# Patient Record
Sex: Male | Born: 1965
Health system: Southern US, Community
[De-identification: ages and names within clinical notes are randomized; demographics above are authoritative.]

## PROBLEM LIST (undated history)

## (undated) DIAGNOSIS — K859 Acute pancreatitis without necrosis or infection, unspecified: Secondary | ICD-10-CM

## (undated) DIAGNOSIS — K219 Gastro-esophageal reflux disease without esophagitis: Secondary | ICD-10-CM

## (undated) DIAGNOSIS — I252 Old myocardial infarction: Secondary | ICD-10-CM

## (undated) DIAGNOSIS — J302 Other seasonal allergic rhinitis: Secondary | ICD-10-CM

## (undated) DIAGNOSIS — F329 Major depressive disorder, single episode, unspecified: Secondary | ICD-10-CM

## (undated) DIAGNOSIS — I10 Essential (primary) hypertension: Secondary | ICD-10-CM

## (undated) DIAGNOSIS — F32A Depression, unspecified: Secondary | ICD-10-CM

## (undated) HISTORY — DX: Depression, unspecified: F32.A

## (undated) HISTORY — DX: Old myocardial infarction: I25.2

## (undated) HISTORY — DX: Essential (primary) hypertension: I10

## (undated) HISTORY — DX: Major depressive disorder, single episode, unspecified: F32.9

## (undated) HISTORY — PX: LITHOTRIPSY: SUR834

---

## 2011-04-19 DIAGNOSIS — I252 Old myocardial infarction: Secondary | ICD-10-CM

## 2011-04-19 HISTORY — PX: CORONARY STENT PLACEMENT: SHX1402

## 2011-04-19 HISTORY — DX: Old myocardial infarction: I25.2

## 2017-06-21 ENCOUNTER — Encounter: Payer: Self-pay | Admitting: Family Medicine

## 2017-06-21 ENCOUNTER — Ambulatory Visit (INDEPENDENT_AMBULATORY_CARE_PROVIDER_SITE_OTHER): Payer: Self-pay | Admitting: Family Medicine

## 2017-06-21 VITALS — BP 122/78 | HR 84 | Temp 98.4°F | Ht 70.0 in | Wt 226.2 lb

## 2017-06-21 DIAGNOSIS — I2510 Atherosclerotic heart disease of native coronary artery without angina pectoris: Secondary | ICD-10-CM

## 2017-06-21 DIAGNOSIS — J0100 Acute maxillary sinusitis, unspecified: Secondary | ICD-10-CM

## 2017-06-21 MED ORDER — ROSUVASTATIN CALCIUM 20 MG PO TABS: 20.0000 mg | ORAL_TABLET | Freq: Every day | ORAL | 0 refills | Status: DC

## 2017-06-21 MED ORDER — AMOXICILLIN-POT CLAVULANATE 875-125 MG PO TABS: 1.0000 | ORAL_TABLET | Freq: Two times a day (BID) | ORAL | 0 refills | Status: DC

## 2017-06-21 NOTE — Patient Instructions (Addendum)
Continue to push fluids, practice good hand hygiene, and cover your mouth if you cough.  If you start having fevers, shaking or shortness of breath, seek immediate care.  Ibuprofen 400-600 mg (2-3 over the counter strength tabs) every 6 hours as needed for pain.  OK to take Tylenol 1000 mg (2 extra strength tabs) or 975 mg (3 regular strength tabs) every 6 hours as needed.  If this new medicine is too expensive, let us know. Do not fill it.  Let us know if you need anything.

## 2017-06-21 NOTE — Progress Notes (Signed)
Pre visit review using our clinic review tool, if applicable. No additional management support is needed unless otherwise documented below in the visit note. 

## 2017-06-21 NOTE — Progress Notes (Signed)
Chief Complaint  Patient presents with  . Establish Care  . Sinusitis       New Patient Visit SUBJECTIVE: HPI: Luke Vasquez is an 52 y.o.male who is being seen for establishing care.  Duration: 2 weeks  Associated symptoms: sinus congestion, sinus pain, rhinorrhea, itchy watery eyes and cough Denies: ear pain, ear drainage, sore throat, wheezing, shortness of breath, myalgia and fevers Treatment to date: Dayquil Sick contacts: Yes   2013 had MI. Used to be on atorvastatin but did not tolerate it well. Admittedly does not drink much fluids. No Cp or SOB.  No Known Allergies  Past Medical History:  Diagnosis Date  . History of heart attack 2013  . Hypertension    Past Surgical History:  Procedure Laterality Date  . LITHOTRIPSY     Social History   Socioeconomic History  . Marital status: Single  Tobacco Use  . Smoking status: Former Research scientist (life sciences)  . Smokeless tobacco: Never Used  Substance and Sexual Activity  . Alcohol use: Yes    Comment: socially  . Drug use: No   Family History  Problem Relation Age of Onset  . Hypertension Mother   . Hypertension Father      Current Outpatient Medications:  .  aspirin EC 81 MG tablet, Take 81 mg by mouth daily., Disp: , Rfl:  .  cetirizine (ZYRTEC) 10 MG tablet, Take 10 mg by mouth daily., Disp: , Rfl:  .  lisinopril (PRINIVIL,ZESTRIL) 10 MG tablet, Take 10 mg by mouth daily., Disp: , Rfl:  .  montelukast (SINGULAIR) 10 MG tablet, Take 10 mg by mouth at bedtime., Disp: , Rfl:  .  amoxicillin-clavulanate (AUGMENTIN) 875-125 MG tablet, Take 1 tablet by mouth 2 (two) times daily., Disp: 20 tablet, Rfl: 0 .  rosuvastatin (CRESTOR) 20 MG tablet, Take 1 tablet (20 mg total) by mouth daily., Disp: 30 tablet, Rfl: 0  ROS Cardiovascular: Denies chest pain  Respiratory: Denies dyspnea   OBJECTIVE: BP 122/78 (BP Location: Left Arm, Patient Position: Sitting, Cuff Size: Large)   Pulse 84   Temp 98.4 F (36.9 C) (Oral)   Ht 5\' 10"   (1.778 m)   Wt 226 lb 4 oz (102.6 kg)   SpO2 96%   BMI 32.46 kg/m   Constitutional: -  VS reviewed -  Well developed, well nourished, appears stated age -  No apparent distress  Psychiatric: -  Oriented to person, place, and time -  Memory intact -  Affect and mood normal -  Fluent conversation, good eye contact -  Judgment and insight age appropriate  Eye: -  Conjunctivae clear, no discharge -  Pupils symmetric, round, reactive to light  ENMT: -  MMM    Pharynx moist, no exudate, no erythema -  Nares patent w/o dc, +TTP over max sinuses b/l -  Ears neg b/l  Neck: -  No gross swelling, no palpable masses -  Thyroid midline, not enlarged, mobile, no palpable masses  Cardiovascular: -  RRR -  No LE edema  Respiratory: -  Normal respiratory effort, no accessory muscle use, no retraction -  Breath sounds equal, no wheezes, no ronchi, no crackles  Musculoskeletal: -  No clubbing, no cyanosis -  Gait normal  Skin: -  No significant lesion on inspection -  Warm and dry to palpation   ASSESSMENT/PLAN: Acute maxillary sinusitis, recurrence not specified - Plan: amoxicillin-clavulanate (AUGMENTIN) 875-125 MG tablet  Coronary artery disease involving native heart without angina pectoris, unspecified vessel or  lesion type - Plan: aspirin EC 81 MG tablet, rosuvastatin (CRESTOR) 20 MG tablet  Patient instructed to sign release of records form from his previous PCP. Given duration, will treat. Stay on Zyrtec and Singulair, go back on INCS. Start another statin. Stay hydrated, let us know if this is too expensive. Patient should return when he has insurance for CPE. The patient voiced understanding and agreement to the plan.   [place], DO 06/21/17  10:23 AM

## 2017-07-17 ENCOUNTER — Other Ambulatory Visit: Payer: Self-pay | Admitting: Family Medicine

## 2017-07-17 DIAGNOSIS — I2510 Atherosclerotic heart disease of native coronary artery without angina pectoris: Secondary | ICD-10-CM

## 2017-09-03 ENCOUNTER — Other Ambulatory Visit: Payer: Self-pay | Admitting: Family Medicine

## 2017-09-03 DIAGNOSIS — I2510 Atherosclerotic heart disease of native coronary artery without angina pectoris: Secondary | ICD-10-CM

## 2017-10-09 ENCOUNTER — Other Ambulatory Visit: Payer: Self-pay | Admitting: Family Medicine

## 2017-10-09 DIAGNOSIS — I2510 Atherosclerotic heart disease of native coronary artery without angina pectoris: Secondary | ICD-10-CM

## 2017-10-10 ENCOUNTER — Encounter: Payer: Self-pay | Admitting: Family

## 2017-10-10 ENCOUNTER — Ambulatory Visit: Payer: 59 | Admitting: Family

## 2017-10-10 VITALS — BP 125/88 | HR 74 | Temp 98.4°F | Resp 18 | Ht 70.0 in | Wt 213.4 lb

## 2017-10-10 DIAGNOSIS — R110 Nausea: Secondary | ICD-10-CM | POA: Diagnosis not present

## 2017-10-10 DIAGNOSIS — A084 Viral intestinal infection, unspecified: Secondary | ICD-10-CM | POA: Diagnosis not present

## 2017-10-10 DIAGNOSIS — I2510 Atherosclerotic heart disease of native coronary artery without angina pectoris: Secondary | ICD-10-CM

## 2017-10-10 MED ORDER — ONDANSETRON 4 MG PO TBDP: 4.0000 mg | ORAL_TABLET | Freq: Three times a day (TID) | ORAL | 0 refills | Status: DC | PRN

## 2017-10-10 MED ORDER — ROSUVASTATIN CALCIUM 20 MG PO TABS: 20.0000 mg | ORAL_TABLET | Freq: Every day | ORAL | 2 refills | Status: DC

## 2017-10-10 MED ORDER — ONDANSETRON HCL 4 MG/2ML IJ SOLN
4.0000 mg | Freq: Once | INTRAMUSCULAR | Status: AC
  Administered 2017-10-10: 4 mg via INTRAMUSCULAR

## 2017-10-10 MED ORDER — CETIRIZINE HCL 10 MG PO TABS: 10.0000 mg | ORAL_TABLET | Freq: Every day | ORAL | 2 refills | Status: AC

## 2017-10-10 MED FILL — ONDANSETRON ODT 4 MG TABLET: 4 | 7 days supply | Qty: 20 | Fill #0

## 2017-10-10 NOTE — Progress Notes (Signed)
Subjective:    Patient ID: LOC FEINSTEIN, male    DOB: 05-24-1965, 52 y.o.   MRN: [id number]  HPI  Mr.  Hippler is a 52 yr old male who presents today with c/o N/V/Diarrhea. Symptoms began this AM.    Reports that he left the house at 6:15 AM and had + nausea. Vomited multiple times at work.  Had diarrhea x 1 this AM.  Reports some lower abdominal pain. No blood in stool.  He drank 1/2 cup of water this AM.  Denies any known sick contacts or recent travel.   Review of Systems    see HPI  Past Medical History:  Diagnosis Date  . Depression   . History of heart attack 2013  . Hypertension      Social History   Socioeconomic History  . Marital status: Single    Spouse name: Not on file  . Number of children: Not on file  . Years of education: Not on file  . Highest education level: Not on file  Occupational History  . Not on file  Social Needs  . Financial resource strain: Not on file  . Food insecurity:    Worry: Not on file    Inability: Not on file  . Transportation needs:    Medical: Not on file    Non-medical: Not on file  Tobacco Use  . Smoking status: Former Research scientist (life sciences)  . Smokeless tobacco: Never Used  Substance and Sexual Activity  . Alcohol use: Yes    Comment: socially  . Drug use: No  . Sexual activity: Not on file  Lifestyle  . Physical activity:    Days per week: Not on file    Minutes per session: Not on file  . Stress: Not on file  Relationships  . Social connections:    Talks on phone: Not on file    Gets together: Not on file    Attends religious service: Not on file    Active member of club or organization: Not on file    Attends meetings of clubs or organizations: Not on file    Relationship status: Not on file  . Intimate partner violence:    Fear of current or ex partner: Not on file    Emotionally abused: Not on file    Physically abused: Not on file    Forced sexual activity: Not on file  Other Topics Concern  . Not on file    Social History Narrative  . Not on file    Past Surgical History:  Procedure Laterality Date  . CORONARY STENT PLACEMENT  2013  . LITHOTRIPSY      Family History  Problem Relation Age of Onset  . Hypertension Mother   . Hypertension Father   . High blood pressure Brother     No Known Allergies  Current Outpatient Medications on File Prior to Visit  Medication Sig Dispense Refill  . aspirin EC 81 MG tablet Take 81 mg by mouth daily.    Marland Kitchen lisinopril (PRINIVIL,ZESTRIL) 10 MG tablet Take 10 mg by mouth daily.    . montelukast (SINGULAIR) 10 MG tablet Take 10 mg by mouth at bedtime.     No current facility-administered medications on file prior to visit.     BP 125/88 (BP Location: Right Arm, Cuff Size: Normal)   Pulse 74   Temp 98.4 F (36.9 C) (Oral)   Resp 18   Ht 5\' 10"  (1.778 m)   Wt 213 lb  6.4 oz (96.8 kg)   SpO2 97%   BMI 30.62 kg/m    Objective:   Physical Exam  Constitutional: He is oriented to person, place, and time. He appears well-developed and well-nourished. No distress.  HENT:  Head: Normocephalic and atraumatic.  Cardiovascular: Normal rate and regular rhythm.  No murmur heard. Pulmonary/Chest: Effort normal and breath sounds normal. No respiratory distress. He has no wheezes. He has no rales.  Abdominal: He exhibits no distension. Bowel sounds are increased. There is generalized tenderness. There is no rigidity and no guarding.  Musculoskeletal: He exhibits no edema.  Neurological: He is alert and oriented to person, place, and time.  Skin: Skin is warm and dry.  Psychiatric: He has a normal mood and affect. His behavior is normal. Thought content normal.          Assessment & Plan:  Viral gastroenteritis-symptoms are most consistent with viral gastroenteritis.  Zofran 4 mg IM here in the office.  He is encouraged to drink small frequent sips of liquids such as Gatorade today and slowly advance his diet as tolerated.  He may use oral Zofran  every 6-8 hours as needed.  He is advised that should he develop worsening abdominal pain or inability to keep down food or liquid that he should proceed to the ER.  Patient verbalizes understanding.

## 2017-10-10 NOTE — Patient Instructions (Signed)
You may use zofran 4mg  every 6-8 hrs as needed for nausea/vomitting. Drink small frequent sips of gatorade today.  Go to the ER if you develop severe/worsening abdominal pain.   Viral Gastroenteritis, Adult Viral gastroenteritis is also known as the stomach flu. This condition is caused by certain germs (viruses). These germs can be passed from person to person very easily (are very contagious). This condition can cause sudden watery poop (diarrhea), fever, and throwing up (vomiting). Having watery poop and throwing up can make you feel weak and cause you to get dehydrated. Dehydration can make you tired and thirsty, make you have a dry mouth, and make it so you pee (urinate) less often. Older adults and people with other diseases or a weak defense system (immune system) are at higher risk for dehydration. It is important to replace the fluids that you lose from having watery poop and throwing up. Follow these instructions at home: Follow instructions from your doctor about how to care for yourself at home. Eating and drinking  Follow these instructions as told by your doctor:  Take an oral rehydration solution (ORS). This is a drink that is sold at pharmacies and stores.  Drink clear fluids in small amounts as you are able, such as: ? Water. ? Ice chips. ? Diluted fruit juice. ? Low-calorie sports drinks.  Eat bland, easy-to-digest foods in small amounts as you are able, such as: ? Bananas. ? Applesauce. ? Rice. ? Low-fat (lean) meats. ? Toast. ? Crackers.  Avoid fluids that have a lot of sugar or caffeine in them.  Avoid alcohol.  Avoid spicy or fatty foods.  General instructions  Drink enough fluid to keep your pee (urine) clear or pale yellow.  Wash your hands often. If you cannot use soap and water, use hand sanitizer.  Make sure that all people in your home wash their hands well and often.  Rest at home while you get better.  Take over-the-counter and prescription  medicines only as told by your doctor.  Watch your condition for any changes.  Take a warm bath to help with any burning or pain from having watery poop.  Keep all follow-up visits as told by your doctor. This is important. Contact a doctor if:  You cannot keep fluids down.  Your symptoms get worse.  You have new symptoms.  You feel light-headed or dizzy.  You have muscle cramps. Get help right away if:  You have chest pain.  You feel very weak or you pass out (faint).  You see blood in your throw-up.  Your throw-up looks like coffee grounds.  You have bloody or black poop (stools) or poop that look like tar.  You have a very bad headache, a stiff neck, or both.  You have a rash.  You have very bad pain, cramping, or bloating in your belly (abdomen).  You have trouble breathing.  You are breathing very quickly.  Your heart is beating very quickly.  Your skin feels cold and clammy.  You feel confused.  You have pain when you pee.  You have signs of dehydration, such as: ? Dark pee, hardly any pee, or no pee. ? Cracked lips. ? Dry mouth. ? Sunken eyes. ? Sleepiness. ? Weakness. This information is not intended to replace advice given to you by your health care provider. Make sure you discuss any questions you have with your health care provider. Document Released: 09/21/2007 Document Revised: 10/23/2015 Document Reviewed: 12/09/2014 Elsevier Interactive Patient Education  2017 Elsevier Inc.  

## 2017-10-11 ENCOUNTER — Encounter: Payer: Self-pay | Admitting: Family

## 2017-10-11 ENCOUNTER — Telehealth: Payer: Self-pay | Admitting: Family Medicine

## 2017-10-11 NOTE — Telephone Encounter (Signed)
Letter placed at front desk for pick up. Left detailed message on pt's voicemail. Advised pt to call if he has a fax # to send letter to employer, otherwise may pick up letter tomorrow from 7am to 5pm.

## 2017-10-11 NOTE — Telephone Encounter (Signed)
Copied from [place] [phone]. Topic: General - Other >> Oct 11, 2017  7:55 AM Synthia Innocent wrote: Reason for CRM: Patient is still not feeling well, requesting a work note for today also. Was told at appt he could call back for extended work note. Please advise.

## 2017-10-11 NOTE — Telephone Encounter (Signed)
Pt called back to check on status of work note. Please advise

## 2017-10-11 NOTE — Telephone Encounter (Signed)
Copied from [place] [id number]. Topic: General - Other >> Oct 11, 2017  5:04 PM Cecelia Byars, NT wrote: Reason for CRM: Patient called to leave fax # for  note for work  [;ease fax to [phone] , thanks

## 2017-10-11 NOTE — Telephone Encounter (Signed)
Please see letter- it has been updated.

## 2017-10-12 ENCOUNTER — Telehealth: Payer: Self-pay | Admitting: Family Medicine

## 2017-10-12 NOTE — Telephone Encounter (Signed)
Copied from [place] [id number]. Topic: General - Other >> Oct 12, 2017  9:14 AM Judyann Munson wrote: Reason for CRM:  patient is calling to state he has not received his doctor note that was faxed yesterday, he is requesting it be faxed again to this number [phone]). Please advise   Letter has been now faxed/patient informed/will pickup copy next week of letter as well.

## 2017-10-13 NOTE — Telephone Encounter (Signed)
Work note faxed to below # and detailed message left on pt's voicemail regarding completion.

## 2017-11-02 ENCOUNTER — Ambulatory Visit: Payer: Self-pay | Admitting: *Deleted

## 2017-11-02 NOTE — Telephone Encounter (Signed)
Patient phoned while at work. He began having  chest pressure yesterday that has not subsided. He took antacid tabs yesterday with no relief. Noted he is talking in broken sentences and sounds in distress while we talk. Advised patient call 911 immediately. He is at work with the nearest emergency facility being [facility].   Reason for Disposition . [1] Chest pain lasts > 5 minutes AND [2] history of heart disease  (i.e., heart attack, bypass surgery, angina, angioplasty, CHF; not just a heart murmur)  Answer Assessment - Initial Assessment Questions 1. LOCATION: "Where does it hurt?"       Mid line chest pain-pressure 2. RADIATION: "Does the pain go anywhere else?" (e.g., into neck, jaw, arms, back)     no 3. ONSET: "When did the chest pain begin?" (Minutes, hours or days)      Started yesterday morning.  4. PATTERN "Does the pain come and go, or has it been constant since it started?"  "Does it get worse with exertion?"      constant 5. DURATION: "How long does it last" (e.g., seconds, minutes, hours)     Started yesterday and has not stopped. 6. SEVERITY: "How bad is the pain?"  (e.g., Scale 1-10; mild, moderate, or severe)    - MILD (1-3): doesn't interfere with normal activities     - MODERATE (4-7): interferes with normal activities or awakens from sleep    - SEVERE (8-10): excruciating pain, unable to do any normal activities       Patient stated 6 7. CARDIAC RISK FACTORS: "Do you have any history of heart problems or risk factors for heart disease?" (e.g., prior heart attack, angina; high blood pressure, diabetes, being overweight, high cholesterol, smoking, or strong family history of heart disease)     Hx of MI 8. PULMONARY RISK FACTORS: "Do you have any history of lung disease?"  (e.g., blood clots in lung, asthma, emphysema, birth control pills)      9. CAUSE: "What do you think is causing the chest pain?"     10. OTHER SYMPTOMS: "Do you have any other symptoms?" (e.g.,  dizziness, nausea, vomiting, sweating, fever, difficulty breathing, cough)        11. PREGNANCY: "Is there any chance you are pregnant?" "When was your last menstrual period?"       na  Protocols used: CHEST PAIN-A-AH

## 2017-11-06 ENCOUNTER — Encounter: Payer: Self-pay | Admitting: Family Medicine

## 2017-11-06 ENCOUNTER — Ambulatory Visit (INDEPENDENT_AMBULATORY_CARE_PROVIDER_SITE_OTHER): Payer: 59 | Admitting: Family Medicine

## 2017-11-06 VITALS — BP 108/72 | HR 68 | Temp 98.5°F | Ht 70.0 in | Wt 218.0 lb

## 2017-11-06 DIAGNOSIS — R1013 Epigastric pain: Secondary | ICD-10-CM | POA: Diagnosis not present

## 2017-11-06 MED ORDER — OMEPRAZOLE 20 MG PO CPDR: 20.0000 mg | DELAYED_RELEASE_CAPSULE | Freq: Two times a day (BID) | ORAL | 1 refills | Status: DC

## 2017-11-06 MED ORDER — GI COCKTAIL ~~LOC~~
30.0000 mL | Freq: Once | ORAL | Status: AC
  Administered 2017-11-06: 30 mL via ORAL

## 2017-11-06 NOTE — Patient Instructions (Signed)
The only lifestyle changes that have data behind them are weight loss for the overweight/obese and elevating the head of the bed. Finding out which foods/positions are triggers is important.  OK to stop Ranitidine.  Let us know if you need anything.

## 2017-11-06 NOTE — Progress Notes (Signed)
Chief Complaint  Patient presents with  . Hospitalization Follow-up    Subjective: Patient is a 52 y.o. male here for ER f/u.  Went to [facility] ER 4 d ago after having 1 d of epigastric/low chest pain. Nitro helped. Neg CT abd/pelvis, EKG/trop. Feels better, still having issues. Takes H2 blocker at home. Denies fevers, bleeding, nighttime awakenings.    ROS: Const- no fevers GI- as noted in HPI  Past Medical History:  Diagnosis Date  . Depression   . History of heart attack 2013  . Hypertension     Objective: BP 108/72 (BP Location: Left Arm, Patient Position: Sitting, Cuff Size: Large)   Pulse 68   Temp 98.5 F (36.9 C) (Oral)   Ht 5\' 10"  (1.778 m)   Wt 218 lb (98.9 kg)   SpO2 97%   BMI 31.28 kg/m  General: Awake, appears stated age HEENT: MMM, EOMi Heart: RRR, no LE edema Lungs: CTAB, no rales, wheezes or rhonchi. No accessory muscle use Abd: BS+, soft, ttp diffusely, worse in epigastric region Psych: Age appropriate judgment and insight, normal affect and mood  Assessment and Plan: Epigastric abdominal pain - Plan: omeprazole (PRILOSEC) 20 MG capsule  Orders as above. 60 d total. OK to stop H2 blocker. AVS recs given. GI cocktail in office did help. F/u prn.  The patient voiced understanding and agreement to the plan.  [place], DO 11/06/17  1:20 PM

## 2017-11-06 NOTE — Progress Notes (Signed)
Pre visit review using our clinic review tool, if applicable. No additional management support is needed unless otherwise documented below in the visit note. 

## 2017-11-06 NOTE — Addendum Note (Signed)
Addended by: Sharon Seller B on: 11/06/2017 01:25 PM   Modules accepted: Orders

## 2017-11-10 ENCOUNTER — Telehealth: Payer: Self-pay | Admitting: Family Medicine

## 2017-11-10 ENCOUNTER — Other Ambulatory Visit: Payer: Self-pay | Admitting: Family Medicine

## 2017-11-10 DIAGNOSIS — I2510 Atherosclerotic heart disease of native coronary artery without angina pectoris: Secondary | ICD-10-CM

## 2017-11-10 NOTE — Telephone Encounter (Signed)
Copied from [place] [id number]. Topic: Quick Communication - Rx Refill/Question >> Nov 10, 2017 10:20 AM Burchel, Abbi R wrote: Medication: rosuvastatin (CRESTOR) 20 MG tablet   Preferred Pharmacy:[facility] [address] - [place], [place] - [address] [phone] (Phone) [phone] (Fax)  Pt has 1 dose left.  Pt was advised that RX refills may take up to 3 business days.

## 2017-11-12 ENCOUNTER — Other Ambulatory Visit: Payer: Self-pay | Admitting: Family Medicine

## 2017-11-12 DIAGNOSIS — I2510 Atherosclerotic heart disease of native coronary artery without angina pectoris: Secondary | ICD-10-CM

## 2017-11-13 NOTE — Telephone Encounter (Signed)
Already refilled in another encounter

## 2017-11-20 ENCOUNTER — Emergency Department (HOSPITAL_BASED_OUTPATIENT_CLINIC_OR_DEPARTMENT_OTHER)
Admission: EM | Admit: 2017-11-20 | Discharge: 2017-11-20 | Disposition: A | Discharge status: Home / Self Care | Payer: 59 | Attending: Emergency Medicine | Admitting: Emergency Medicine

## 2017-11-20 ENCOUNTER — Other Ambulatory Visit: Payer: Self-pay

## 2017-11-20 ENCOUNTER — Ambulatory Visit: Payer: Self-pay | Admitting: *Deleted

## 2017-11-20 ENCOUNTER — Encounter (HOSPITAL_BASED_OUTPATIENT_CLINIC_OR_DEPARTMENT_OTHER): Payer: Self-pay | Admitting: Emergency Medicine

## 2017-11-20 ENCOUNTER — Emergency Department (HOSPITAL_BASED_OUTPATIENT_CLINIC_OR_DEPARTMENT_OTHER): Payer: 59

## 2017-11-20 DIAGNOSIS — R509 Fever, unspecified: Secondary | ICD-10-CM | POA: Diagnosis present

## 2017-11-20 DIAGNOSIS — Z7982 Long term (current) use of aspirin: Secondary | ICD-10-CM | POA: Diagnosis not present

## 2017-11-20 DIAGNOSIS — J40 Bronchitis, not specified as acute or chronic: Secondary | ICD-10-CM | POA: Diagnosis not present

## 2017-11-20 DIAGNOSIS — I10 Essential (primary) hypertension: Secondary | ICD-10-CM | POA: Insufficient documentation

## 2017-11-20 DIAGNOSIS — Z87891 Personal history of nicotine dependence: Secondary | ICD-10-CM | POA: Insufficient documentation

## 2017-11-20 HISTORY — DX: Gastro-esophageal reflux disease without esophagitis: K21.9

## 2017-11-20 HISTORY — DX: Other seasonal allergic rhinitis: J30.2

## 2017-11-20 HISTORY — DX: Acute pancreatitis without necrosis or infection, unspecified: K85.90

## 2017-11-20 MED ORDER — AZITHROMYCIN 250 MG PO TABS: 250.0000 mg | ORAL_TABLET | Freq: Every day | ORAL | 0 refills | Status: DC

## 2017-11-20 MED FILL — AZITHROMYCIN 250 MG TABLET: 250 | 5 days supply | Qty: 6 | Fill #0

## 2017-11-20 NOTE — ED Triage Notes (Signed)
Fever, chills, body aches, and cough x3 days, while at the beach.  Took Aleve at 4am this morning. Pt sts fever of 103 prior to Aleve.

## 2017-11-20 NOTE — ED Notes (Signed)
ED Provider at bedside. 

## 2017-11-20 NOTE — Telephone Encounter (Signed)
Pt called with having a fever of 103 this morning at 3 am. He took Aleve and now it is 97.7. He is wheezing, no hx of respiratory diseases. He also has a congested cough. He sound weak. He is not drinking enough fluids. He was dx with pancreatitis a couple of weeks ago. He also woke up with chills on Friday when he was at the beach. Also he has been sweating, per pt, nonstop. And he does not normally sweat. Pt advised to be seen in the closest emergency department. He is going to [facility] ED.  Pt voiced understanding. Will route to flow at [facility] at [place].   Reason for Disposition . Patient sounds very sick or weak to the triager  (Exception: mild weakness and hasn't taken fever medicine)  Answer Assessment - Initial Assessment Questions 1. TEMPERATURE: "What is the most recent temperature?"  "How was it measured?"      103.4 this morning 2. ONSET: "When did the fever start?"      Friday 3. SYMPTOMS: "Do you have any other symptoms besides the fever?"  (e.g., colds, headache, sore throat, earache, cough, rash, diarrhea, vomiting, abdominal pain)     Headache yesterday 4. CAUSE: If there are no symptoms, ask: "What do you think is causing the fever?"      pancreatitis  5. CONTACTS: "Does anyone else in the family have an infection?"     no 6. TREATMENT: "What have you done so far to treat this fever?" (e.g., medications)     Aleve  7. IMMUNOCOMPROMISE: "Do you have of the following: diabetes, HIV positive, splenectomy, cancer chemotherapy, chronic steroid treatment, transplant patient, etc."     no 8. PREGNANCY: "Is there any chance you are pregnant?" "When was your last menstrual period?"     n/a 9. TRAVEL: "Have you traveled out of the country in the last month?" (e.g., travel history, exposures)     no  Protocols used: FEVER-A-AH

## 2017-11-20 NOTE — Discharge Instructions (Signed)
Return here if you are unable to get an appointment with your doctor and the fever continues or you start developing severe shortness of breath.

## 2017-11-20 NOTE — ED Provider Notes (Signed)
[facility] Provider Note   CSN: [id number] Arrival date & time: 11/20/17  4163     History   Chief Complaint Chief Complaint  Patient presents with  . Fever    HPI Luke Vasquez is a 52 y.o. male.  The history is provided by the patient.  URI   This is a new problem. Episode onset: 4 days ago. The problem has been gradually worsening. The maximum temperature recorded prior to his arrival was 103 to 104 F. The fever has been present for 3 to 4 days. Associated symptoms include congestion, headaches, cough and wheezing. Pertinent negatives include no chest pain, no abdominal pain, no diarrhea, no nausea, no vomiting, no dysuria, no ear pain, no plugged ear sensation, no rhinorrhea, no sore throat and no neck pain. Associated symptoms comments: No SOB.  Some wheezing and rattling in the chest at night.  Cough productive of yellow sputum. Treatments tried: otc meds. The treatment provided no relief.    Past Medical History:  Diagnosis Date  . Depression   . GERD (gastroesophageal reflux disease)   . History of heart attack 2013  . Hypertension   . Pancreatitis   . Seasonal allergies     There are no active problems to display for this patient.   Past Surgical History:  Procedure Laterality Date  . CORONARY STENT PLACEMENT  2013  . LITHOTRIPSY          Home Medications    Prior to Admission medications   Medication Sig Start Date End Date Taking? Authorizing Provider  aspirin EC 81 MG tablet Take 81 mg by mouth daily.    [provider]  cetirizine (ZYRTEC) 10 MG tablet Take 1 tablet (10 mg total) by mouth daily. 10/10/17   Shelda Pal, DO  famotidine (PEPCID) 20 MG tablet Take 20 mg by mouth 2 (two) times daily. 11/02/17   [provider]  lisinopril (PRINIVIL,ZESTRIL) 10 MG tablet Take 10 mg by mouth daily.    [provider]  metoCLOPramide (REGLAN) 10 MG tablet Take 1 tablet by mouth 4 (four)  times daily as needed. 11/02/17   [provider]  montelukast (SINGULAIR) 10 MG tablet Take 10 mg by mouth at bedtime.    [provider]  omeprazole (PRILOSEC) 20 MG capsule Take 1 capsule (20 mg total) by mouth 2 (two) times daily before a meal. 11/06/17   Wendling, Crosby Oyster, DO  ondansetron (ZOFRAN ODT) 4 MG disintegrating tablet Take 1 tablet (4 mg total) by mouth every 8 (eight) hours as needed for nausea or vomiting. 10/10/17   Debbrah Alar, NP  rosuvastatin (CRESTOR) 20 MG tablet TAKE ONE TABLET BY MOUTH ONE TIME DAILY 11/13/17   Shelda Pal, DO    Family History Family History  Problem Relation Age of Onset  . Hypertension Mother   . Hypertension Father   . High blood pressure Brother     Social History Social History   Tobacco Use  . Smoking status: Former Research scientist (life sciences)  . Smokeless tobacco: Never Used  Substance Use Topics  . Alcohol use: Yes    Comment: socially  . Drug use: No     Allergies   Patient has no known allergies.   Review of Systems Review of Systems  HENT: Positive for congestion. Negative for ear pain, rhinorrhea and sore throat.   Respiratory: Positive for cough and wheezing.   Cardiovascular: Negative for chest pain.  Gastrointestinal: Negative for abdominal pain,  diarrhea, nausea and vomiting.  Genitourinary: Negative for dysuria.  Musculoskeletal: Negative for neck pain.  Neurological: Positive for headaches.  All other systems reviewed and are negative.    Physical Exam Updated Vital Signs BP 134/86 (BP Location: Right Arm)   Pulse 86   Temp 98.9 F (37.2 C) (Oral)   Resp 18   SpO2 95%   Physical Exam  Constitutional: He is oriented to person, place, and time. He appears well-developed and well-nourished. No distress.  HENT:  Head: Normocephalic and atraumatic.  Right Ear: A middle ear effusion is present.  Left Ear: A middle ear effusion is present.  Mouth/Throat: Oropharynx is clear and moist.  No oropharyngeal exudate, posterior oropharyngeal edema or posterior oropharyngeal erythema. No tonsillar exudate.  Eyes: Pupils are equal, round, and reactive to light. Conjunctivae and EOM are normal.  Neck: Normal range of motion. Neck supple.  Cardiovascular: Normal rate, regular rhythm and intact distal pulses.  No murmur heard. Pulmonary/Chest: Effort normal and breath sounds normal. No respiratory distress. He has no wheezes. He has no rales.  Abdominal: Soft. He exhibits no distension. There is no tenderness. There is no rebound and no guarding.  Musculoskeletal: Normal range of motion. He exhibits no edema or tenderness.  Neurological: He is alert and oriented to person, place, and time.  Skin: Skin is warm and dry. No rash noted. No erythema.  Psychiatric: He has a normal mood and affect. His behavior is normal.  Nursing note and vitals reviewed.    ED Treatments / Results  Labs (all labs ordered are listed, but only abnormal results are displayed) Labs Reviewed - No data to display  EKG None  Radiology Dg Chest 2 View  Result Date: 11/20/2017 CLINICAL DATA:  Fever, cough, and chills since Friday EXAM: CHEST - 2 VIEW COMPARISON:  None. FINDINGS: Normal heart size and mediastinal contours. No acute infiltrate or edema. No effusion or pneumothorax. No acute osseous findings. IMPRESSION: No evident pneumonia. Electronically Signed   By: Monte Fantasia M.D.   On: 11/20/2017 09:05    Procedures Procedures (including critical care time)  Medications Ordered in ED Medications - No data to display   Initial Impression / Assessment and Plan / ED Course  I have reviewed the triage vital signs and the nursing notes.  Pertinent labs & imaging results that were available during my care of the patient were reviewed by me and considered in my medical decision making (see chart for details).     Pt with symptoms consistent with viral URI vs bronchitis.  Well appearing here.  No  signs of breathing difficulty  No signs of pharyngitis, otitis or abnormal abdominal findings.  Pt was dx with pancreatitis about 1.5 weeks ago but states it pretty much has resolved.  No abdominal findings concerning for that being the cause of his fever. CXR wnl and pt to return with any further problems.   Final Clinical Impressions(s) / ED Diagnoses   Final diagnoses:  Bronchitis    ED Discharge Orders        Ordered    azithromycin (ZITHROMAX) 250 MG tablet  Daily     11/20/17 0928       Blanchie Dessert, MD 11/20/17 [phone]

## 2017-11-20 NOTE — ED Notes (Signed)
Patient transported to X-ray 

## 2017-12-05 ENCOUNTER — Other Ambulatory Visit: Payer: Self-pay | Admitting: Family Medicine

## 2017-12-05 DIAGNOSIS — J0100 Acute maxillary sinusitis, unspecified: Secondary | ICD-10-CM

## 2017-12-08 ENCOUNTER — Ambulatory Visit: Payer: 59 | Admitting: Family Medicine

## 2017-12-08 ENCOUNTER — Encounter: Payer: Self-pay | Admitting: Family Medicine

## 2017-12-08 VITALS — BP 108/62 | HR 93 | Temp 99.3°F | Ht 70.0 in | Wt 219.1 lb

## 2017-12-08 DIAGNOSIS — J189 Pneumonia, unspecified organism: Secondary | ICD-10-CM

## 2017-12-08 DIAGNOSIS — J181 Lobar pneumonia, unspecified organism: Secondary | ICD-10-CM

## 2017-12-08 MED ORDER — DOXYCYCLINE HYCLATE 100 MG PO TABS: 100.0000 mg | ORAL_TABLET | Freq: Two times a day (BID) | ORAL | 0 refills | Status: AC

## 2017-12-08 MED ORDER — BENZONATATE 100 MG PO CAPS: 100.0000 mg | ORAL_CAPSULE | Freq: Three times a day (TID) | ORAL | 0 refills | Status: DC | PRN

## 2017-12-08 MED FILL — BENZONATATE 100 MG CAPSULE: 100 | 10 days supply | Qty: 30 | Fill #0

## 2017-12-08 MED FILL — DOXYCYCLINE HYCLATE 100 MG: 100 | 7 days supply | Qty: 14 | Fill #0

## 2017-12-08 NOTE — Progress Notes (Signed)
Chief Complaint  Patient presents with  . Cough    Marene Lenz here for URI complaints.  Duration: 3 weeks  Associated symptoms: Fever (103 F), sinus congestion, sinus pain, rhinorrhea, ear pain, sore throat, wheezing and cough Denies: itchy watery eyes, ear drainage and shortness of breath Treatment to date: Zpak, amox (left over), Mucinex, Benadryl Sick contacts: No  ROS:  Const: +fevers HEENT: As noted in HPI Lungs: No SOB  Past Medical History:  Diagnosis Date  . Depression   . GERD (gastroesophageal reflux disease)   . History of heart attack 2013  . Hypertension   . Pancreatitis   . Seasonal allergies     BP 108/62 (BP Location: Left Arm, Patient Position: Sitting, Cuff Size: Normal)   Pulse 93   Temp 99.3 F (37.4 C) (Oral)   Ht 5\' 10"  (1.778 m)   Wt 219 lb 2 oz (99.4 kg)   SpO2 94%   BMI 31.44 kg/m  General: Awake, alert, appears stated age HEENT: AT, [place], ears patent b/l and TM's neg, nares patent w/o discharge, pharynx pink and without exudates, MMM Neck: No masses or asymmetry Heart: RRR Lungs: +rales in R lower lung field no accessory muscle use Psych: Age appropriate judgment and insight, normal mood and affect  Pneumonia of right lower lobe due to infectious organism (HCC) - Plan: doxycycline (VIBRA-TABS) 100 MG tablet, benzonatate (TESSALON) 100 MG capsule  Orders as above. Continue to push fluids, practice good hand hygiene, cover mouth when coughing. F/u 1 in week if no better. If starting to experience worsening fevers, shaking, or shortness of breath, seek immediate care. Pt voiced understanding and agreement to the plan.  [place], DO 12/08/17 4:38 PM

## 2017-12-08 NOTE — Progress Notes (Signed)
Pre visit review using our clinic review tool, if applicable. No additional management support is needed unless otherwise documented below in the visit note. 

## 2017-12-08 NOTE — Patient Instructions (Signed)
Continue to push fluids, practice good hand hygiene, and cover your mouth if you cough.  If you start having fevers, shaking or shortness of breath, seek immediate care.  Cancel appointment if you are feeling better.  Don't use cough medicine if you are coughing things up.   Let us know if you need anything.

## 2017-12-12 ENCOUNTER — Other Ambulatory Visit: Payer: Self-pay | Admitting: Family Medicine

## 2017-12-12 DIAGNOSIS — I2510 Atherosclerotic heart disease of native coronary artery without angina pectoris: Secondary | ICD-10-CM

## 2017-12-20 ENCOUNTER — Ambulatory Visit: Payer: 59 | Admitting: Family Medicine

## 2017-12-27 ENCOUNTER — Emergency Department (HOSPITAL_BASED_OUTPATIENT_CLINIC_OR_DEPARTMENT_OTHER): Payer: 59

## 2017-12-27 ENCOUNTER — Telehealth: Payer: Self-pay

## 2017-12-27 ENCOUNTER — Encounter (HOSPITAL_BASED_OUTPATIENT_CLINIC_OR_DEPARTMENT_OTHER): Payer: Self-pay | Admitting: Emergency Medicine

## 2017-12-27 ENCOUNTER — Other Ambulatory Visit: Payer: Self-pay

## 2017-12-27 ENCOUNTER — Emergency Department (HOSPITAL_BASED_OUTPATIENT_CLINIC_OR_DEPARTMENT_OTHER)
Admission: EM | Admit: 2017-12-27 | Discharge: 2017-12-27 | Disposition: A | Discharge status: Home / Self Care | Payer: 59 | Attending: Emergency Medicine | Admitting: Emergency Medicine

## 2017-12-27 DIAGNOSIS — I10 Essential (primary) hypertension: Secondary | ICD-10-CM | POA: Diagnosis not present

## 2017-12-27 DIAGNOSIS — Z7982 Long term (current) use of aspirin: Secondary | ICD-10-CM | POA: Diagnosis not present

## 2017-12-27 DIAGNOSIS — Z87891 Personal history of nicotine dependence: Secondary | ICD-10-CM | POA: Insufficient documentation

## 2017-12-27 DIAGNOSIS — R05 Cough: Secondary | ICD-10-CM | POA: Diagnosis present

## 2017-12-27 DIAGNOSIS — J069 Acute upper respiratory infection, unspecified: Secondary | ICD-10-CM

## 2017-12-27 DIAGNOSIS — Z79899 Other long term (current) drug therapy: Secondary | ICD-10-CM | POA: Insufficient documentation

## 2017-12-27 MED ORDER — GUAIFENESIN-DM 100-10 MG/5ML PO SYRP: 5.0000 mL | ORAL_SOLUTION | Freq: Three times a day (TID) | ORAL | 0 refills | Status: DC | PRN

## 2017-12-27 MED ORDER — PREDNISONE 20 MG PO TABS: 40.0000 mg | ORAL_TABLET | Freq: Every day | ORAL | 0 refills | Status: DC

## 2017-12-27 NOTE — Telephone Encounter (Signed)
Could he come tomorrow at 1240ish? Double book 1 PM with 1240 note if he is able. Otherwise OK to place in sameday slot. TY.

## 2017-12-27 NOTE — ED Provider Notes (Signed)
[facility] Provider Note   CSN: [id number] Arrival date & time: 12/27/17  1093     History   Chief Complaint Chief Complaint  Patient presents with  . Cough    HPI Luke Vasquez is a 52 y.o. male.  HPI Patient presents with cough.  Has had over the last month.  Diagnosed with bronchitis in the ER and given azithromycin.  Followed up in a week or 2 later with PCP and at that time it localized findings on the lower right side and was started on doxycycline.  Continued coughing.  States he has chills.  No frank fevers.  States he does feel cold however.  Some mild sputum production and has dull pain in his mid chest.  Had been seen in the ER around 2 months ago at no font had some dull chest pain at that time and an mild alcohol-related pancreatitis.  States his abdominal pain has resolved.  Has coughed until he is vomited.  No diarrhea.  Has some dull abdominal pain that he states is from the coughing.  Had CT scan done 2 months ago. Past Medical History:  Diagnosis Date  . Depression   . GERD (gastroesophageal reflux disease)   . History of heart attack 2013  . Hypertension   . Pancreatitis   . Seasonal allergies     There are no active problems to display for this patient.   Past Surgical History:  Procedure Laterality Date  . CORONARY STENT PLACEMENT  2013  . LITHOTRIPSY          Home Medications    Prior to Admission medications   Medication Sig Start Date End Date Taking? Authorizing Provider  aspirin EC 81 MG tablet Take 81 mg by mouth daily.    [provider]  cetirizine (ZYRTEC) 10 MG tablet Take 1 tablet (10 mg total) by mouth daily. 10/10/17   Shelda Pal, DO  famotidine (PEPCID) 20 MG tablet Take 20 mg by mouth 2 (two) times daily. 11/02/17   [provider]  guaiFENesin-dextromethorphan (ROBITUSSIN DM) 100-10 MG/5ML syrup Take 5 mLs by mouth 3 (three) times daily as needed for cough. 12/27/17    Davonna Belling, MD  lisinopril (PRINIVIL,ZESTRIL) 10 MG tablet Take 10 mg by mouth daily.    [provider]  montelukast (SINGULAIR) 10 MG tablet Take 10 mg by mouth at bedtime.    [provider]  omeprazole (PRILOSEC) 20 MG capsule Take 1 capsule (20 mg total) by mouth 2 (two) times daily before a meal. 11/06/17   Wendling, Crosby Oyster, DO  predniSONE (DELTASONE) 20 MG tablet Take 2 tablets (40 mg total) by mouth daily. 12/27/17   Davonna Belling, MD  rosuvastatin (CRESTOR) 20 MG tablet TAKE ONE TABLET BY MOUTH ONE TIME DAILY 12/13/17   Shelda Pal, DO    Family History Family History  Problem Relation Age of Onset  . Hypertension Mother   . Hypertension Father   . High blood pressure Brother     Social History Social History   Tobacco Use  . Smoking status: Former Research scientist (life sciences)  . Smokeless tobacco: Never Used  Substance Use Topics  . Alcohol use: Yes    Comment: socially  . Drug use: No     Allergies   Patient has no known allergies.   Review of Systems Review of Systems  Constitutional: Positive for appetite change and chills.  HENT: Negative for congestion.   Respiratory: Positive for cough  and shortness of breath.   Cardiovascular: Positive for chest pain.  Gastrointestinal: Positive for abdominal pain.  Genitourinary: Negative for flank pain.  Musculoskeletal: Negative for back pain.  Neurological: Negative for weakness.  Hematological: Negative for adenopathy.  Psychiatric/Behavioral: Negative for behavioral problems.     Physical Exam Updated Vital Signs BP 137/87 (BP Location: Right Arm)   Pulse 85   Temp 98.4 F (36.9 C) (Oral)   Resp 20   Ht 5\' 10"  (1.778 m)   Wt 99.3 kg   SpO2 96%   BMI 31.41 kg/m   Physical Exam  Constitutional: He appears well-developed.  HENT:  Head: Atraumatic.  Eyes: Pupils are equal, round, and reactive to light.  Neck: Neck supple.  Pulmonary/Chest:  Harsh breath sounds, particularly  at right base.  Abdominal: There is no tenderness.  Musculoskeletal: He exhibits no tenderness.  Neurological: He is alert.  Skin: Skin is warm. Capillary refill takes less than 2 seconds.     ED Treatments / Results  Labs (all labs ordered are listed, but only abnormal results are displayed) Labs Reviewed - No data to display  EKG None  Radiology Dg Chest 2 View  Result Date: 12/27/2017 CLINICAL DATA:  Cough and shortness of breath.  Fever. EXAM: CHEST - 2 VIEW COMPARISON:  November 20, 2017 FINDINGS: There is no evident edema or consolidation. Heart size and pulmonary vascularity are normal. No adenopathy. No bone lesions. IMPRESSION: No edema or consolidation. Electronically Signed   By: Lowella Grip III M.D.   On: 12/27/2017 07:43    Procedures Procedures (including critical care time)  Medications Ordered in ED Medications - No data to display   Initial Impression / Assessment and Plan / ED Course  I have reviewed the triage vital signs and the nursing notes.  Pertinent labs & imaging results that were available during my care of the patient were reviewed by me and considered in my medical decision making (see chart for details).     Patient with URI symptoms.  Recent pneumonia and has been on antibiotics.  No relief.  Will give steroids now.  X-ray reassuring.  Patient has follow-up with his PCP tomorrow who can make the choice about further antibiotics or further work-up.  Final Clinical Impressions(s) / ED Diagnoses   Final diagnoses:  Upper respiratory tract infection, unspecified type    ED Discharge Orders         Ordered    predniSONE (DELTASONE) 20 MG tablet  Daily     12/27/17 0808    guaiFENesin-dextromethorphan (ROBITUSSIN DM) 100-10 MG/5ML syrup  3 times daily PRN     12/27/17 8416           Davonna Belling, MD 12/27/17 0830

## 2017-12-27 NOTE — Telephone Encounter (Signed)
Called pt and LVM for pt to call the office to schedule an appt with DR Nani Ravens for tomorrow at 12:40 or 1:00.

## 2017-12-27 NOTE — ED Notes (Signed)
Patient transported to X-ray 

## 2017-12-27 NOTE — Progress Notes (Signed)
RT note: RT instructed patient with use of Incentive Spirometer using teach back method. Patient displayed proper use and understanding.

## 2017-12-27 NOTE — Telephone Encounter (Signed)
Pt. called [facility] who called author, requesting hospital f/u appointment for URI to be made for Friday 9/13 at 315PM with Dr. Nani Ravens. Chief Strategy Officer routed to [employer], CMA, to ask PCP if he would approve override. Pt. currently has appointment with PA for 9/12, but pt. wants to be seen by his PCP.

## 2017-12-27 NOTE — Discharge Instructions (Addendum)
Follow-up with your doctor tomorrow as planned.  He can help decide whether he would need another round of antibiotics.  With persistent cough and pain may benefit from pulmonology follow-up also.

## 2017-12-27 NOTE — ED Triage Notes (Signed)
Pt sts he was diagnosed with Bronchitis and then 2 weeks ago diagnosed with Pneumonia. Sts he has taken 2 rounds of ABX and is still coughing and not feeling any better.

## 2017-12-28 ENCOUNTER — Ambulatory Visit: Payer: 59 | Admitting: Family Medicine

## 2017-12-28 ENCOUNTER — Ambulatory Visit: Payer: 59 | Admitting: Medical

## 2017-12-28 ENCOUNTER — Encounter: Payer: Self-pay | Admitting: Family Medicine

## 2017-12-28 VITALS — BP 112/68 | HR 84 | Temp 98.1°F | Ht 70.0 in | Wt 221.0 lb

## 2017-12-28 DIAGNOSIS — R05 Cough: Secondary | ICD-10-CM | POA: Diagnosis not present

## 2017-12-28 DIAGNOSIS — R058 Other specified cough: Secondary | ICD-10-CM

## 2017-12-28 MED ORDER — BENZONATATE 100 MG PO CAPS: 100.0000 mg | ORAL_CAPSULE | Freq: Three times a day (TID) | ORAL | 1 refills | Status: DC | PRN

## 2017-12-28 MED ORDER — FLUTICASONE PROPIONATE 50 MCG/ACT NA SUSP: 2.0000 | Freq: Every day | NASAL | 2 refills | Status: DC

## 2017-12-28 MED FILL — FLUTICASONE PROP 50 MCG SPR: 50 | 30 days supply | Qty: 16 | Fill #0

## 2017-12-28 MED FILL — BENZONATATE 100 MG CAPSULE: 100 | 10 days supply | Qty: 30 | Fill #0

## 2017-12-28 NOTE — Progress Notes (Signed)
Chief Complaint  Patient presents with  . Pneumonia    Subjective: Patient is a 52 y.o. male here for f/u cough.  Dx'd with PNA in Aug, txd w Zpak and I changed him to Doxy after no improvement. Feels around 60% better but still having a dry cough, worse at night. Feels pressure in ears, sinuses also. He does have a hx of allergies and is taking Zyrtec and Singulair. Seen in ED yesterday and given Robitussin DM and pred without relief. CXR neg. No unintentional wt loss.  ROS: Const: no fevers Lungs: +cough   Past Medical History:  Diagnosis Date  . Depression   . GERD (gastroesophageal reflux disease)   . History of heart attack 2013  . Hypertension   . Pancreatitis   . Seasonal allergies     Objective: BP 112/68 (BP Location: Left Arm, Patient Position: Sitting, Cuff Size: Normal)   Pulse 84   Temp 98.1 F (36.7 C) (Oral)   Ht 5\' 10"  (1.778 m)   Wt 221 lb (100.2 kg)   SpO2 97%   BMI 31.71 kg/m  General: Awake, appears stated age HEENT: MMM, EOMi, ears neg, nares patent with turbinate edema. Heart: RRR, no murmurs Lungs: CTAB, no rales, wheezes or rhonchi. No accessory muscle use Psych: Age appropriate judgment and insight, normal affect and mood  Assessment and Plan: Post-viral cough syndrome - Plan: benzonatate (TESSALON) 100 MG capsule  Orders as above. Cover s's/s. Can last up to 4-6 weeks. Call for refills. Let us know over weekend if nights are still rough and can call in syrup for nighttime use only. F/u prn at this point.  The patient voiced understanding and agreement to the plan.  [place], DO 12/28/17  1:13 PM

## 2017-12-28 NOTE — Progress Notes (Signed)
Pre visit review using our clinic review tool, if applicable. No additional management support is needed unless otherwise documented below in the visit note. 

## 2017-12-28 NOTE — Patient Instructions (Addendum)
Post-infectious cough is what you are dealing.  Send me a message over the weekend if nights are tough.  This can last 4-6 weeks. Let us know if you need refills.  Fevers, unintentional weight loss, worsening symptoms should prompt seeking care.  Go back on your Flonase.  Let us know if you need anything.

## 2018-01-06 ENCOUNTER — Other Ambulatory Visit: Payer: Self-pay | Admitting: Family Medicine

## 2018-01-06 DIAGNOSIS — R1013 Epigastric pain: Secondary | ICD-10-CM

## 2018-01-11 ENCOUNTER — Other Ambulatory Visit: Payer: Self-pay | Admitting: Family Medicine

## 2018-01-11 DIAGNOSIS — I2510 Atherosclerotic heart disease of native coronary artery without angina pectoris: Secondary | ICD-10-CM

## 2018-03-20 ENCOUNTER — Encounter: Payer: Self-pay | Admitting: Family Medicine

## 2018-03-20 ENCOUNTER — Encounter: Payer: Self-pay | Admitting: Emergency Medicine

## 2018-03-20 ENCOUNTER — Ambulatory Visit: Payer: 59 | Admitting: Family Medicine

## 2018-03-20 VITALS — BP 110/80 | HR 96 | Temp 98.3°F | Wt 230.8 lb

## 2018-03-20 DIAGNOSIS — R197 Diarrhea, unspecified: Secondary | ICD-10-CM | POA: Diagnosis not present

## 2018-03-20 NOTE — Progress Notes (Signed)
Luke Vasquez - 52 y.o. male MRN [id number]  Date of birth: 1965/11/12  Subjective Chief Complaint  Patient presents with  . Diarrhea    HPI Luke Vasquez is a 52 y.o. male with history of GERD and pancreatitis here today with complaint of recurrent diarrhea. He reports current episode started about 3 days ago but has had off and on for the past few weeks.  Stool has ranged from loose to watery, typically 3-4 BM per day.  He reports stool often has foul odor.  He does have some abdominal cramping and bloating but no significant pain.  Stool does not appear greasy and he has not seen any blood in his stool.  He reports that his fiancee has had similar symptoms as well.  He denies any potential contaminated food or water sources.  His fiancee works as Clinical biochemist of a retirement community.    ROS:  A comprehensive ROS was completed and negative except as noted per HPI  No Known Allergies  Past Medical History:  Diagnosis Date  . Depression   . GERD (gastroesophageal reflux disease)   . History of heart attack 2013  . Hypertension   . Pancreatitis   . Seasonal allergies     Past Surgical History:  Procedure Laterality Date  . CORONARY STENT PLACEMENT  2013  . LITHOTRIPSY      Social History   Socioeconomic History  . Marital status: Single    Spouse name: Not on file  . Number of children: Not on file  . Years of education: Not on file  . Highest education level: Not on file  Occupational History  . Not on file  Social Needs  . Financial resource strain: Not on file  . Food insecurity:    Worry: Not on file    Inability: Not on file  . Transportation needs:    Medical: Not on file    Non-medical: Not on file  Tobacco Use  . Smoking status: Former Research scientist (life sciences)  . Smokeless tobacco: Never Used  Substance and Sexual Activity  . Alcohol use: Yes    Comment: socially  . Drug use: No  . Sexual activity: Not on file  Lifestyle  . Physical activity:    Days per week:  Not on file    Minutes per session: Not on file  . Stress: Not on file  Relationships  . Social connections:    Talks on phone: Not on file    Gets together: Not on file    Attends religious service: Not on file    Active member of club or organization: Not on file    Attends meetings of clubs or organizations: Not on file    Relationship status: Not on file  Other Topics Concern  . Not on file  Social History Narrative  . Not on file    Family History  Problem Relation Age of Onset  . Hypertension Mother   . Hypertension Father   . High blood pressure Brother     Health Maintenance  Topic Date Due  . HIV Screening  07/23/1981  . TETANUS/TDAP  07/23/1985  . COLONOSCOPY  07/23/2016  . INFLUENZA VACCINE  11/16/2017    ----------------------------------------------------------------------------------------------------------------------------------------------------------------------------------------------------------------- Physical Exam BP 110/80   Pulse 96   Temp 98.3 F (36.8 C)   Wt 230 lb 12.8 oz (104.7 kg)   SpO2 94%   BMI 33.12 kg/m   Physical Exam  Constitutional: He is oriented to person, place, and time.  He appears well-nourished. No distress.  HENT:  Head: Normocephalic and atraumatic.  Mouth/Throat: Oropharynx is clear and moist.  Eyes: No scleral icterus.  Neck: Neck supple. No thyromegaly present.  Cardiovascular: Normal rate, regular rhythm and normal heart sounds.  Pulmonary/Chest: Breath sounds normal.  Abdominal: Soft. Bowel sounds are normal. He exhibits no distension. There is no tenderness. There is no guarding.  Neurological: He is alert and oriented to person, place, and time.  Skin: Skin is warm and dry. No rash noted.  Psychiatric: He has a normal mood and affect. His behavior is normal.     ------------------------------------------------------------------------------------------------------------------------------------------------------------------------------------------------------------------- Assessment and Plan  Diarrhea -R/o infectious etiology given recurrent symptoms and fiancee with similar symptoms.   -differential includes infectious etiology, ibs, ibd, and chronic pancreatitis.

## 2018-03-20 NOTE — Assessment & Plan Note (Signed)
-  R/o infectious etiology given recurrent symptoms and fiancee with similar symptoms.   -differential includes infectious etiology, ibs, ibd, and chronic pancreatitis.

## 2018-03-20 NOTE — Patient Instructions (Signed)
Diarrhea, Adult °Diarrhea is when you have loose and water poop (stool) often. Diarrhea can make you feel weak and cause you to get dehydrated. Dehydration can make you tired and thirsty, make you have a dry mouth, and make it so you pee (urinate) less often. Diarrhea often lasts 2-3 days. However, it can last longer if it is a sign of something more serious. It is important to treat your diarrhea as told by your doctor. °Follow these instructions at home: °Eating and drinking ° °Follow these recommendations as told by your doctor: °· Take an oral rehydration solution (ORS). This is a drink that is sold at pharmacies and stores. °· Drink clear fluids, such as: °? Water. °? Ice chips. °? Diluted fruit juice. °? Low-calorie sports drinks. °· Eat bland, easy-to-digest foods in small amounts as you are able. These foods include: °? Bananas. °? Applesauce. °? Rice. °? Low-fat (lean) meats. °? Toast. °? Crackers. °· Avoid drinking fluids that have a lot of sugar or caffeine in them. °· Avoid alcohol. °· Avoid spicy or fatty foods. ° °General instructions ° °· Drink enough fluid to keep your pee (urine) clear or pale yellow. °· Wash your hands often. If you cannot use soap and water, use hand sanitizer. °· Make sure that all people in your home wash their hands well and often. °· Take over-the-counter and prescription medicines only as told by your doctor. °· Rest at home while you get better. °· Watch your condition for any changes. °· Take a warm bath to help with any burning or pain from having diarrhea. °· Keep all follow-up visits as told by your doctor. This is important. °Contact a doctor if: °· You have a fever. °· Your diarrhea gets worse. °· You have new symptoms. °· You cannot keep fluids down. °· You feel light-headed or dizzy. °· You have a headache. °· You have muscle cramps. °Get help right away if: °· You have chest pain. °· You feel very weak or you pass out (faint). °· You have bloody or black poop or  poop that look like tar. °· You have very bad pain, cramping, or bloating in your belly (abdomen). °· You have trouble breathing or you are breathing very quickly. °· Your heart is beating very quickly. °· Your skin feels cold and clammy. °· You feel confused. °· You have signs of dehydration, such as: °? Dark pee, hardly any pee, or no pee. °? Cracked lips. °? Dry mouth. °? Sunken eyes. °? Sleepiness. °? Weakness. °This information is not intended to replace advice given to you by your health care provider. Make sure you discuss any questions you have with your health care provider. °Document Released: 09/21/2007 Document Revised: 10/23/2015 Document Reviewed: 12/09/2014 °Elsevier Interactive Patient Education © 2018 [employer] Inc[employer] ° °

## 2018-03-21 ENCOUNTER — Other Ambulatory Visit: Payer: 59

## 2018-03-21 ENCOUNTER — Telehealth: Payer: Self-pay | Admitting: Family Medicine

## 2018-03-21 ENCOUNTER — Encounter: Payer: Self-pay | Admitting: Emergency Medicine

## 2018-03-21 NOTE — Telephone Encounter (Signed)
Please extend for today, will need f/u with pcp if needing continued excuse.

## 2018-03-21 NOTE — Telephone Encounter (Signed)
Left a VM for patient informing him that letter is up front for pick up.

## 2018-03-21 NOTE — Telephone Encounter (Signed)
Copied from [place] [id number]. Topic: General - Inquiry >> Mar 21, 2018  8:18 AM Margot Ables wrote: Reason for CRM: pt called stating he was unable to go to work. He is still having the same symptoms as yesterday during OV with Dr. Zigmund Daniel. Pt requesting a note be written excusing him from work. Please call him to come pick it up. >> Mar 21, 2018 10:32 AM Keene Breath wrote: Patient called again to request a note for his employer stating that he is still sick and cannot go to work.  Please advise and call patient back at [phone]

## 2018-03-22 ENCOUNTER — Encounter (HOSPITAL_BASED_OUTPATIENT_CLINIC_OR_DEPARTMENT_OTHER): Payer: Self-pay | Admitting: Emergency Medicine

## 2018-03-22 ENCOUNTER — Other Ambulatory Visit: Payer: Self-pay

## 2018-03-22 ENCOUNTER — Emergency Department (HOSPITAL_BASED_OUTPATIENT_CLINIC_OR_DEPARTMENT_OTHER)
Admission: EM | Admit: 2018-03-22 | Discharge: 2018-03-22 | Disposition: A | Discharge status: Home / Self Care | Payer: 59 | Attending: Emergency Medicine | Admitting: Emergency Medicine

## 2018-03-22 ENCOUNTER — Emergency Department (HOSPITAL_BASED_OUTPATIENT_CLINIC_OR_DEPARTMENT_OTHER): Payer: 59

## 2018-03-22 DIAGNOSIS — Z7982 Long term (current) use of aspirin: Secondary | ICD-10-CM | POA: Insufficient documentation

## 2018-03-22 DIAGNOSIS — I10 Essential (primary) hypertension: Secondary | ICD-10-CM | POA: Insufficient documentation

## 2018-03-22 DIAGNOSIS — Z87891 Personal history of nicotine dependence: Secondary | ICD-10-CM | POA: Diagnosis not present

## 2018-03-22 DIAGNOSIS — R1032 Left lower quadrant pain: Secondary | ICD-10-CM | POA: Diagnosis present

## 2018-03-22 DIAGNOSIS — Z79899 Other long term (current) drug therapy: Secondary | ICD-10-CM | POA: Diagnosis not present

## 2018-03-22 DIAGNOSIS — N200 Calculus of kidney: Secondary | ICD-10-CM | POA: Diagnosis not present

## 2018-03-22 LAB — CBC
HCT: 46.8 % (ref 39.0–52.0)
Hemoglobin: 16.2 g/dL (ref 13.0–17.0)
MCH: 30.9 pg (ref 26.0–34.0)
MCHC: 34.6 g/dL (ref 30.0–36.0)
MCV: 89.3 fL (ref 80.0–100.0)
Platelets: 158 10*3/uL (ref 150–400)
RBC: 5.24 MIL/uL (ref 4.22–5.81)
RDW: 12.5 % (ref 11.5–15.5)
WBC: 6.9 10*3/uL (ref 4.0–10.5)
nRBC: 0 % (ref 0.0–0.2)

## 2018-03-22 LAB — COMPREHENSIVE METABOLIC PANEL
ALBUMIN: 4 g/dL (ref 3.5–5.0)
ALT: 31 U/L (ref 0–44)
AST: 27 U/L (ref 15–41)
Alkaline Phosphatase: 36 U/L — ABNORMAL LOW (ref 38–126)
Anion gap: 8 (ref 5–15)
BUN: 21 mg/dL — ABNORMAL HIGH (ref 6–20)
CO2: 22 mmol/L (ref 22–32)
Calcium: 8.8 mg/dL — ABNORMAL LOW (ref 8.9–10.3)
Chloride: 109 mmol/L (ref 98–111)
Creatinine, Ser: 1 mg/dL (ref 0.61–1.24)
GFR calc Af Amer: >60 mL/min (ref >60)
GFR calc non Af Amer: >60 mL/min (ref >60)
GLUCOSE: 116 mg/dL — AB (ref 70–99)
Potassium: 4 mmol/L (ref 3.5–5.1)
Sodium: 139 mmol/L (ref 135–145)
TOTAL PROTEIN: 6.6 g/dL (ref 6.5–8.1)
Total Bilirubin: 0.5 mg/dL (ref 0.3–1.2)

## 2018-03-22 LAB — URINALYSIS, ROUTINE W REFLEX MICROSCOPIC
BILIRUBIN URINE: NEGATIVE
Glucose, UA: NEGATIVE mg/dL
HGB URINE DIPSTICK: NEGATIVE
Ketones, ur: NEGATIVE mg/dL
Leukocytes, UA: NEGATIVE
Nitrite: NEGATIVE
PROTEIN: NEGATIVE mg/dL
Specific Gravity, Urine: >1.03 — ABNORMAL HIGH (ref 1.005–1.030)
pH: 5.5 (ref 5.0–8.0)

## 2018-03-22 LAB — LIPASE, BLOOD: Lipase: 33 U/L (ref 11–51)

## 2018-03-22 MED ORDER — ONDANSETRON HCL 4 MG PO TABS: 4.0000 mg | ORAL_TABLET | Freq: Three times a day (TID) | ORAL | 0 refills | Status: DC | PRN

## 2018-03-22 MED ORDER — KETOROLAC TROMETHAMINE 15 MG/ML IJ SOLN
15.0000 mg | Freq: Once | INTRAMUSCULAR | Status: AC
  Administered 2018-03-22: 15 mg via INTRAVENOUS
  Filled 2018-03-22: qty 1

## 2018-03-22 MED ORDER — SODIUM CHLORIDE 0.9 % IV BOLUS
1000.0000 mL | Freq: Once | INTRAVENOUS | Status: AC
  Administered 2018-03-22: 1000 mL via INTRAVENOUS

## 2018-03-22 MED ORDER — HYDROCODONE-ACETAMINOPHEN 5-325 MG PO TABS: 1.0000 | ORAL_TABLET | Freq: Four times a day (QID) | ORAL | 0 refills | Status: DC | PRN

## 2018-03-22 MED ORDER — MORPHINE SULFATE (PF) 4 MG/ML IV SOLN
4.0000 mg | Freq: Once | INTRAVENOUS | Status: AC
  Administered 2018-03-22: 4 mg via INTRAVENOUS
  Filled 2018-03-22: qty 1

## 2018-03-22 MED ORDER — ONDANSETRON HCL 4 MG/2ML IJ SOLN
4.0000 mg | Freq: Once | INTRAMUSCULAR | Status: AC
  Administered 2018-03-22: 4 mg via INTRAVENOUS
  Filled 2018-03-22: qty 2

## 2018-03-22 MED FILL — ONDANSETRON HCL 4 MG TABLET: 4 | 6 days supply | Qty: 20 | Fill #0

## 2018-03-22 MED FILL — HYDROCODON-APAP 5-325: 5-325 | 5 days supply | Qty: 20 | Fill #0

## 2018-03-22 NOTE — ED Notes (Signed)
ED Provider at bedside. 

## 2018-03-22 NOTE — ED Provider Notes (Signed)
[facility] EMERGENCY DEPARTMENT Provider Note   CSN: [id number] Arrival date & time: 03/22/18  2725  History   Chief Complaint No chief complaint on file.   HPI Luke Vasquez is a 52 y.o. male w/ PMH of pancreatitis, GERD, renal stones, prostatitis who presents with sudden onset lower back and lower abdominal pain around 0400 this morning. He reports burning with urination. Denies fevers or chills. Has had diarrhea and vomiting this past week that he states is unrelated to this current episode. He is taking immodium for this and had seen his PCP who ordered stool studies that are still pending. He tried aleve this morning for pain with minimal relief. He reports pain is sharp and stabbing and is currently 7/10.   HPI  Past Medical History:  Diagnosis Date  . Depression   . GERD (gastroesophageal reflux disease)   . History of heart attack 2013  . Hypertension   . Pancreatitis   . Seasonal allergies     Patient Active Problem List   Diagnosis Date Noted  . Diarrhea 03/20/2018    Past Surgical History:  Procedure Laterality Date  . CORONARY STENT PLACEMENT  2013  . LITHOTRIPSY         Home Medications    Prior to Admission medications   Medication Sig Start Date End Date Taking? Authorizing Provider  aspirin EC 81 MG tablet Take 81 mg by mouth daily.   Yes [provider]  cetirizine (ZYRTEC) 10 MG tablet Take 1 tablet (10 mg total) by mouth daily. 10/10/17  Yes Shelda Pal, DO  famotidine (PEPCID) 20 MG tablet Take 20 mg by mouth 2 (two) times daily. 11/02/17  Yes [provider]  lisinopril (PRINIVIL,ZESTRIL) 10 MG tablet Take 10 mg by mouth daily.   Yes [provider]  montelukast (SINGULAIR) 10 MG tablet Take 10 mg by mouth at bedtime.   Yes [provider]  omeprazole (PRILOSEC) 20 MG capsule TAKE ONE CAPSULE BY MOUTH TWICE A DAY BEFORE A MEAL 01/09/18  Yes Shelda Pal, DO  rosuvastatin  (CRESTOR) 20 MG tablet TAKE ONE TABLET BY MOUTH ONE TIME DAILY 01/11/18  Yes Shelda Pal, DO  fluticasone (FLONASE) 50 MCG/ACT nasal spray Place 2 sprays into both nostrils daily. 12/28/17   Shelda Pal, DO  HYDROcodone-acetaminophen (NORCO/VICODIN) 5-325 MG tablet Take 1 tablet by mouth every 6 (six) hours as needed for moderate pain. 03/22/18   Steve Rattler, DO  ondansetron (ZOFRAN) 4 MG tablet Take 1 tablet (4 mg total) by mouth every 8 (eight) hours as needed for nausea or vomiting. 03/22/18   Steve Rattler, DO  predniSONE (DELTASONE) 20 MG tablet Take 2 tablets (40 mg total) by mouth daily. 12/27/17   Davonna Belling, MD    Family History Family History  Problem Relation Age of Onset  . Hypertension Mother   . Hypertension Father   . High blood pressure Brother     Social History Social History   Tobacco Use  . Smoking status: Former Research scientist (life sciences)  . Smokeless tobacco: Never Used  Substance Use Topics  . Alcohol use: Yes    Comment: socially  . Drug use: No     Allergies   Patient has no known allergies.   Review of Systems Review of Systems  Constitutional: Positive for diaphoresis. Negative for activity change, appetite change, chills, fatigue and fever.  HENT: Negative for congestion and sore throat.   Respiratory: Negative for shortness of  breath.   Cardiovascular: Negative for chest pain.  Gastrointestinal: Positive for abdominal pain. Negative for blood in stool, constipation, diarrhea, nausea, rectal pain and vomiting.  Genitourinary: Positive for dysuria and flank pain. Negative for discharge and testicular pain.  Musculoskeletal: Positive for back pain.  Skin: Negative for rash and wound.  Neurological: Negative for weakness and headaches.     Physical Exam Updated Vital Signs BP (!) 151/94 (BP Location: Right Arm)   Pulse 68   Temp 98.2 F (36.8 C) (Oral)   Resp 18   Ht 5\' 10"  (1.778 m)   Wt 104.3 kg   SpO2 94%   BMI 33.00  kg/m   Physical Exam  Constitutional: He is oriented to person, place, and time. He appears well-developed and well-nourished. No distress.  HENT:  Head: Normocephalic and atraumatic.  Mouth/Throat: Oropharynx is clear and moist.  Eyes: EOM are normal.  Neck: Neck supple.  Cardiovascular: Normal rate and regular rhythm.  Pulmonary/Chest: Effort normal and breath sounds normal.  Abdominal: Soft. Normal appearance. Bowel sounds are decreased. There is tenderness in the suprapubic area. There is CVA tenderness (left).  Musculoskeletal: He exhibits no edema.  Neurological: He is alert and oriented to person, place, and time.  Skin: Skin is dry.  Psychiatric: He has a normal mood and affect.  Nursing note and vitals reviewed.    ED Treatments / Results  Labs (all labs ordered are listed, but only abnormal results are displayed) Labs Reviewed  URINALYSIS, ROUTINE W REFLEX MICROSCOPIC - Abnormal; Notable for the following components:      Result Value   Specific Gravity, Urine >1.030 (*)    All other components within normal limits  COMPREHENSIVE METABOLIC PANEL - Abnormal; Notable for the following components:   Glucose, Bld 116 (*)    BUN 21 (*)    Calcium 8.8 (*)    Alkaline Phosphatase 36 (*)    All other components within normal limits  CBC  LIPASE, BLOOD    EKG None  Radiology Ct Renal Stone Study  Result Date: 03/22/2018 CLINICAL DATA:  Left lower quadrant abdominal pain and bilateral flank pain. Vomiting and diarrhea. EXAM: CT ABDOMEN AND PELVIS WITHOUT CONTRAST TECHNIQUE: Multidetector CT imaging of the abdomen and pelvis was performed following the standard protocol without IV contrast. COMPARISON:  None. FINDINGS: Lower chest: The visualized lung bases are clear. Coronary artery calcification is noted. Hepatobiliary: No focal liver abnormality is seen. No gallstones, gallbladder wall thickening, or biliary dilatation. Pancreas: Unremarkable. Spleen: Unremarkable.  Adrenals/Urinary Tract: Unremarkable adrenal glands. 7.3 cm low-density right lower pole renal lesion compatible with a cyst. Punctate calculus in the interpolar left kidney. No hydronephrosis, ureteral calculi, or ureteral dilatation. Unremarkable bladder. Stomach/Bowel: There is a small sliding hiatal hernia. There is a single loop of gas-filled, borderline dilated small bowel in the left upper quadrant without wall thickening or surrounding inflammatory changes. Scattered colonic diverticula are noted without evidence of diverticulitis. There's no evidence of bowel obstruction. The appendix is unremarkable. Vascular/Lymphatic: Mild abdominal aortic atherosclerosis without aneurysm. No enlarged lymph nodes. Reproductive: Unremarkable prostate. Other: No ascites or pneumoperitoneum. Musculoskeletal: No acute osseous abnormality or suspicious osseous lesion. IMPRESSION: 1. No acute abnormality identified in the abdomen or pelvis. 2. Punctate nonobstructing left renal calculus. 3. Colonic diverticulosis without evidence of diverticulitis. 4.  Aortic Atherosclerosis (ICD10-I70.0). Electronically Signed   By: Logan Bores M.D.   On: 03/22/2018 09:08    Procedures Procedures (including critical care time)  Medications Ordered in ED  Medications  morphine 4 MG/ML injection 4 mg (4 mg Intravenous Given 03/22/18 0755)  ondansetron (ZOFRAN) injection 4 mg (4 mg Intravenous Given 03/22/18 0755)  sodium chloride 0.9 % bolus 1,000 mL (0 mLs Intravenous Stopped 03/22/18 0909)  ketorolac (TORADOL) 15 MG/ML injection 15 mg (15 mg Intravenous Given 03/22/18 0921)     Initial Impression / Assessment and Plan / ED Course  I have reviewed the triage vital signs and the nursing notes.  Pertinent labs & imaging results that were available during my care of the patient were reviewed by me and considered in my medical decision making (see chart for details).     52 year old male presenting with sudden onset suprapubic  and flank pain. History of kidney stones and prostatitis.   On exam he is uncomfortable and tender over left CVA and suprapubic region. Given IV fluids, 4mg  morphine, zofran.  UA without blood, LE, nitrates. CMP, lipase, CBC WNL. Patient feels better after IV pain medications. Will check renal stone CT.   CT demonstrates punctate nonobstructing left renal calculus. Discussed with patient. Given small size these will likely pass on their own. Will d/c patient with zofran, norco. Toradol given prior to discharge. Reasons to return reviewed including severe pain, inability to take PO, fevers. Work note given. Patient verbalized understanding and agreement with plan.   Final Clinical Impressions(s) / ED Diagnoses   Final diagnoses:  Kidney stone    ED Discharge Orders         Ordered    HYDROcodone-acetaminophen (NORCO/VICODIN) 5-325 MG tablet  Every 6 hours PRN     03/22/18 0913    ondansetron (ZOFRAN) 4 MG tablet  Every 8 hours PRN     03/22/18 0913          Lucila Maine, DO PGY-3, [facility] 03/22/2018 9:49 AM    Steve Rattler, DO 03/22/18 9030    Isla Pence, MD 03/22/18 1109

## 2018-03-22 NOTE — ED Notes (Signed)
Patient transported to CT 

## 2018-03-22 NOTE — ED Triage Notes (Addendum)
Abd pain and bil flank pain around 0400 this am. Has been having diarrhea and vomiting this week and has been unable to work. . Was seen by PCP x 2 days ago and dx with virus

## 2018-03-23 LAB — CLOSTRIDIUM DIFFICILE BY PCR: Toxigenic C. Difficile by PCR: NEGATIVE

## 2018-03-24 LAB — GI PROFILE, STOOL, PCR
ADENOVIRUS F 40/41: NOT DETECTED
Astrovirus: NOT DETECTED
C difficile toxin A/B: NOT DETECTED
CRYPTOSPORIDIUM: NOT DETECTED
Campylobacter: NOT DETECTED
Cyclospora cayetanensis: NOT DETECTED
ENTEROAGGREGATIVE E COLI: NOT DETECTED
ENTEROPATHOGENIC E COLI: NOT DETECTED
ENTEROTOXIGENIC E COLI: NOT DETECTED
Entamoeba histolytica: NOT DETECTED
Giardia lamblia: NOT DETECTED
NOROVIRUS GI/GII: NOT DETECTED
Plesiomonas shigelloides: DETECTED — AB
ROTAVIRUS A: NOT DETECTED
SHIGELLA/ENTEROINVASIVE E COLI: NOT DETECTED
Salmonella: NOT DETECTED
Sapovirus: NOT DETECTED
Shiga-toxin-producing E coli: NOT DETECTED
VIBRIO: NOT DETECTED
Vibrio cholerae: NOT DETECTED
Yersinia enterocolitica: NOT DETECTED

## 2018-03-25 ENCOUNTER — Other Ambulatory Visit: Payer: Self-pay | Admitting: Family Medicine

## 2018-03-25 DIAGNOSIS — I2510 Atherosclerotic heart disease of native coronary artery without angina pectoris: Secondary | ICD-10-CM

## 2018-04-24 ENCOUNTER — Other Ambulatory Visit: Payer: Self-pay | Admitting: Family Medicine

## 2018-04-24 DIAGNOSIS — R1013 Epigastric pain: Secondary | ICD-10-CM

## 2018-04-24 DIAGNOSIS — I2510 Atherosclerotic heart disease of native coronary artery without angina pectoris: Secondary | ICD-10-CM

## 2018-05-07 ENCOUNTER — Other Ambulatory Visit: Payer: Self-pay | Admitting: Family Medicine

## 2018-05-07 MED ORDER — MONTELUKAST SODIUM 10 MG PO TABS: 10.0000 mg | ORAL_TABLET | Freq: Every day | ORAL | 3 refills | Status: DC

## 2018-05-24 ENCOUNTER — Other Ambulatory Visit: Payer: Self-pay | Admitting: Family Medicine

## 2018-05-24 DIAGNOSIS — I2510 Atherosclerotic heart disease of native coronary artery without angina pectoris: Secondary | ICD-10-CM

## 2018-06-23 ENCOUNTER — Other Ambulatory Visit: Payer: Self-pay | Admitting: Family Medicine

## 2018-06-23 DIAGNOSIS — R1013 Epigastric pain: Secondary | ICD-10-CM

## 2018-06-23 DIAGNOSIS — I2510 Atherosclerotic heart disease of native coronary artery without angina pectoris: Secondary | ICD-10-CM

## 2018-07-02 ENCOUNTER — Encounter: Payer: Self-pay | Admitting: Family Medicine

## 2018-07-02 ENCOUNTER — Other Ambulatory Visit: Payer: Self-pay

## 2018-07-02 ENCOUNTER — Ambulatory Visit (INDEPENDENT_AMBULATORY_CARE_PROVIDER_SITE_OTHER): Payer: PRIVATE HEALTH INSURANCE | Admitting: Family Medicine

## 2018-07-02 VITALS — BP 122/84 | HR 84 | Temp 98.2°F | Ht 70.0 in | Wt 237.5 lb

## 2018-07-02 DIAGNOSIS — M549 Dorsalgia, unspecified: Secondary | ICD-10-CM

## 2018-07-02 MED ORDER — CYCLOBENZAPRINE HCL 5 MG PO TABS: 5.0000 mg | ORAL_TABLET | Freq: Three times a day (TID) | ORAL | 1 refills | Status: DC | PRN

## 2018-07-02 MED ORDER — MELOXICAM 15 MG PO TABS: 15.0000 mg | ORAL_TABLET | Freq: Every day | ORAL | 0 refills | Status: DC

## 2018-07-02 MED ORDER — KETOROLAC TROMETHAMINE 60 MG/2ML IM SOLN
60.0000 mg | Freq: Once | INTRAMUSCULAR | Status: AC
  Administered 2018-07-02: 60 mg via INTRAMUSCULAR

## 2018-07-02 MED ORDER — TRAMADOL HCL 50 MG PO TABS: 50.0000 mg | ORAL_TABLET | Freq: Three times a day (TID) | ORAL | 0 refills | Status: DC | PRN

## 2018-07-02 MED FILL — traMADol HCL 50 MG TABS: 50 | 5 days supply | Qty: 15 | Fill #0

## 2018-07-02 MED FILL — MELOXICAM 15 MG TABLET: 15 | 30 days supply | Qty: 30 | Fill #0

## 2018-07-02 MED FILL — CYCLOBENZAPRINE HCL 5 MG TA: 5 | 10 days supply | Qty: 30 | Fill #0

## 2018-07-02 NOTE — Patient Instructions (Addendum)

## 2018-07-02 NOTE — Progress Notes (Signed)
Musculoskeletal Exam  Patient: Luke Vasquez DOB: Sep 07, 1965  DOS: 07/02/2018  SUBJECTIVE:  Chief Complaint:   Chief Complaint  Patient presents with  . Back Pain    Luke Vasquez is a 53 y.o.  male for evaluation and treatment of his back pain.   Onset:  Several weeks.  No inj or change in activity.  Location: neck to lower back Character:  sharp  Progression of issue:  has worsened Associated symptoms: decreased ROM Denies bowel/bladder incontinence or weakness Treatment: to date has been [employer].   Neurovascular symptoms: no  ROS: Musculoskeletal/Extremities: +back pain Neurologic: no numbness, tingling no weakness   Past Medical History:  Diagnosis Date  . Depression   . GERD (gastroesophageal reflux disease)   . History of heart attack 2013  . Hypertension   . Pancreatitis   . Seasonal allergies     Objective:  VITAL SIGNS: BP 122/84 (BP Location: Left Arm, Patient Position: Sitting, Cuff Size: Large)   Pulse 84   Temp 98.2 F (36.8 C) (Oral)   Ht 5\' 10"  (1.778 m)   Wt 237 lb 8 oz (107.7 kg)   SpO2 98%   BMI 34.08 kg/m  Constitutional: Well formed, well developed. No acute distress. HENT: Normocephalic, atraumatic.  Thorax & Lungs:  No accessory muscle use Extremities: No clubbing. No cyanosis. No edema.  Skin: Warm. Dry. No erythema. No rash.  Musculoskeletal: back.   Tenderness to palpation: yes over midline Deformity: no Ecchymosis: no Straight leg test: negative for Poor hamstring flexibility b/l. Neurologic: Normal sensory function. No focal deficits noted. DTR's equal and symmetry in LE's. No clonus. Psychiatric: Normal mood. Age appropriate judgment and insight. Alert & oriented x 3.    Assessment:  Acute midline back pain, unspecified back location - Plan: meloxicam (MOBIC) 15 MG tablet, cyclobenzaprine (FLEXERIL) 5 MG tablet, traMADol (ULTRAM) 50 MG tablet, ketorolac (TORADOL) injection 60 mg  Plan: Orders as  above. Stretches/exercises, heat, ice, Tylenol, NSAIDs. Warnings for Flexeril and narcotics verbalized and written down.  F/u prn. The patient voiced understanding and agreement to the plan.   [place], DO 07/02/18  11:04 AM

## 2018-07-19 ENCOUNTER — Ambulatory Visit (INDEPENDENT_AMBULATORY_CARE_PROVIDER_SITE_OTHER): Payer: PRIVATE HEALTH INSURANCE | Admitting: Family Medicine

## 2018-07-19 ENCOUNTER — Other Ambulatory Visit: Payer: Self-pay

## 2018-07-19 ENCOUNTER — Encounter: Payer: Self-pay | Admitting: Family Medicine

## 2018-07-19 VITALS — Temp 97.0°F | Ht 70.0 in | Wt 225.0 lb

## 2018-07-19 DIAGNOSIS — J301 Allergic rhinitis due to pollen: Secondary | ICD-10-CM | POA: Diagnosis not present

## 2018-07-19 MED ORDER — FLUTICASONE PROPIONATE 50 MCG/ACT NA SUSP: 2.0000 | Freq: Every day | NASAL | 2 refills | Status: DC

## 2018-07-19 MED ORDER — PREDNISONE 20 MG PO TABS: 40.0000 mg | ORAL_TABLET | Freq: Every day | ORAL | 0 refills | Status: AC

## 2018-07-19 NOTE — Progress Notes (Signed)
Virtual Visit via Video Note  I connected with Luke Vasquez on 07/19/18 at 10:45 AM EDT by a video enabled telemedicine application and verified that I am speaking with the correct person using two identifiers.   I discussed the limitations of evaluation and management by telemedicine and the availability of in person appointments. The patient expressed understanding and agreed to proceed.  History of Present Illness: 2 d ago having fullness in R ear.  Also having sneezing, runny nose, dizziness, ringing in the right ear, cough, wheezing, and itchy/watery eyes.  He does have a history of allergies and has been taking Singulair and Zyrtec.  Pollen seems to flare his symptoms.  He denies fevers, sick contacts, recent travel, or shortness of breath.   Observations/Objective: No conversational dyspnea Age appropriate judgment and insight Nml affect and mood  Assessment and Plan: Allergic rhinitis due to pollen, unspecified seasonality - Plan: fluticasone (FLONASE) 50 MCG/ACT nasal spray, predniSONE (DELTASONE) 20 MG tablet  Pred burst for ears. Warning signs and symptoms verbalized. Add back INCS, cont Zyrtec and Singulair.   Follow Up Instructions: PRN   I discussed the assessment and treatment plan with the patient. The patient was provided an opportunity to ask questions and all were answered. The patient agreed with the plan and demonstrated an understanding of the instructions.   The patient was advised to call back or seek an in-person evaluation if the symptoms worsen or if the condition fails to improve as anticipated.  [place], DO

## 2018-08-14 ENCOUNTER — Ambulatory Visit (INDEPENDENT_AMBULATORY_CARE_PROVIDER_SITE_OTHER): Payer: PRIVATE HEALTH INSURANCE | Admitting: Family Medicine

## 2018-08-14 ENCOUNTER — Other Ambulatory Visit: Payer: Self-pay

## 2018-08-14 ENCOUNTER — Encounter: Payer: Self-pay | Admitting: Family Medicine

## 2018-08-14 DIAGNOSIS — K529 Noninfective gastroenteritis and colitis, unspecified: Secondary | ICD-10-CM | POA: Diagnosis not present

## 2018-08-14 MED ORDER — DICYCLOMINE HCL 20 MG PO TABS: 20.0000 mg | ORAL_TABLET | Freq: Three times a day (TID) | ORAL | 0 refills | Status: DC

## 2018-08-14 NOTE — Progress Notes (Addendum)
Chief Complaint  Patient presents with  . Abdominal Pain    Luke Vasquez is here for abdominal pain. Due to COVID-19 pandemic, we are interacting via web portal for an electronic face-to-face visit. I verified patient's ID using 2 identifiers. Patient agreed to proceed with visit via this method. Patient is at home, I am at office. Patient and I are present for visit.   Duration: 1 day Central and lower abd pain, sharp/crampy in nature, nausea yesterday, freq BM's. Denies vomiting, fevers, recent travel, new foods, sick contacts, diarrhea, bleeding. Nothing in particular makes it better. Seems when he eats, a late onset wave of pain will follow. No assoc w specific types of food. Treatment to date: none  ROS: Constitutional: No fevers GI: as noted in HPI  Past Medical History:  Diagnosis Date  . Depression   . GERD (gastroesophageal reflux disease)   . History of heart attack 2013  . Hypertension   . Pancreatitis   . Seasonal allergies    Family History  Problem Relation Age of Onset  . Hypertension Mother   . Hypertension Father   . High blood pressure Brother    Past Surgical History:  Procedure Laterality Date  . CORONARY STENT PLACEMENT  2013  . LITHOTRIPSY     Exam: No conversational dyspnea Age appropriate judgment and insight Nml affect and mood I had him push in RLQ area and it did not exacerbate his pain  Gastroenteritis - Plan: dicyclomine (BENTYL) 20 MG tablet  Orders as above. Low likelihood of acute appendicitis. Will cover s/s's. If no improvement, will have him in office.  Letter for work given. F/u prn. Pt voiced understanding and agreement to the plan.  [place], DO 08/14/18 8:44 AM

## 2018-08-17 ENCOUNTER — Encounter: Payer: Self-pay | Admitting: Family Medicine

## 2018-08-17 ENCOUNTER — Telehealth: Payer: Self-pay | Admitting: Family Medicine

## 2018-08-17 NOTE — Telephone Encounter (Signed)
OK to do until Tuesday, go back sooner if feeling better. Ty.

## 2018-08-17 NOTE — Telephone Encounter (Signed)
Copied from [place] [id number]. Topic: Quick Communication - See Telephone Encounter >> Aug 17, 2018  8:26 AM Robina Ade, Helene Kelp D wrote: CRM for notification. See Telephone encounter for: 08/17/18. Patient called and would like for Dr. Nani Ravens to extended his absents for work until Monday, he still not feeling better. Please call pt with any questions.

## 2018-08-17 NOTE — Telephone Encounter (Signed)
Letter done/patient informed/mailed to his home

## 2018-08-27 ENCOUNTER — Telehealth: Payer: Self-pay

## 2018-08-27 NOTE — Telephone Encounter (Signed)
Copied from [place] [id number]. Topic: General - Other >> Aug 27, 2018  9:45 AM Carolyn Stare wrote:  Pt cal lto say his stomach pains has returned, had a virtual on 4.28.2020   req an appt

## 2018-08-27 NOTE — Telephone Encounter (Signed)
Called and patient stated he is having extreme low abdominal pain, sweating this morning/shirt and hair wet, coughing (but states has allergies) no other symptoms and has been at his home//no traveling/work. Spoke to PCP and he advised best to go to the ED. The patient agreed to go to the ED per PCP advisement.

## 2018-09-07 ENCOUNTER — Other Ambulatory Visit: Payer: Self-pay | Admitting: Family Medicine

## 2018-09-11 ENCOUNTER — Other Ambulatory Visit: Payer: Self-pay | Admitting: Family Medicine

## 2018-09-11 MED ORDER — MONTELUKAST SODIUM 10 MG PO TABS: 10.0000 mg | ORAL_TABLET | Freq: Every day | ORAL | 3 refills | Status: DC

## 2018-09-11 NOTE — Telephone Encounter (Signed)
Copied from [place] [id number]. Topic: Quick Communication - Rx Refill/Question >> Sep 11, 2018 11:21 AM Celene Kras A wrote: Medication: montelukast (SINGULAIR) 10 MG tablet  Has the patient contacted their pharmacy? Yes.  Pt states this is a new pharmacy and they have never filled this prescription before. Pt states he has two days worth of medication left. Pt is requesting a 90 day supply. (Agent: If no, request that the patient contact the pharmacy for the refill.) (Agent: If yes, when and what did the pharmacy advise?)  Preferred Pharmacy (with phone number or street name): [facility] [place] - [address] [place] [address] Phone: [phone] Fax: [phone] Not a 24 hour pharmacy; exact hours not known.    Agent: Please be advised that RX refills may take up to 3 business days. We ask that you follow-up with your pharmacy.

## 2018-09-11 NOTE — Telephone Encounter (Signed)
Refills sent

## 2018-09-24 ENCOUNTER — Ambulatory Visit: Payer: Self-pay | Admitting: Family Medicine

## 2018-09-24 NOTE — Telephone Encounter (Signed)
Pt. Reports he started having left flank and lower abdominal pain last Wed. States he has kidney stones in the past and "this it what this feels like." Request an appointment for Thursday. Warm transfer to [place] in the practice.  Answer Assessment - Initial Assessment Questions 1. LOCATION: "Where does it hurt?"      Hurts left flank and low abd. 2. RADIATION: "Does the pain shoot anywhere else?" (e.g., chest, back)     No 3. ONSET: "When did the pain begin?" (Minutes, hours or days ago)      Last Wed. 4. SUDDEN: "Gradual or sudden onset?"     Sudden 5. PATTERN "Does the pain come and go, or is it constant?"    - If constant: "Is it getting better, staying the same, or worsening?"      (Note: Constant means the pain never goes away completely; most serious pain is constant and it progresses)     - If intermittent: "How long does it last?" "Do you have pain now?"     (Note: Intermittent means the pain goes away completely between bouts)     Constant 6. SEVERITY: "How bad is the pain?"  (e.g., Scale 1-10; mild, moderate, or severe)    - MILD (1-3): doesn't interfere with normal activities, abdomen soft and not tender to touch     - MODERATE (4-7): interferes with normal activities or awakens from sleep, tender to touch     - SEVERE (8-10): excruciating pain, doubled over, unable to do any normal activities       6 7. RECURRENT SYMPTOM: "Have you ever had this type of abdominal pain before?" If so, ask: "When was the last time?" and "What happened that time?"      Yes 8. CAUSE: "What do you think is causing the abdominal pain?"     Kidney stone 9. RELIEVING/AGGRAVATING FACTORS: "What makes it better or worse?" (e.g., movement, antacids, bowel movement)     Nothing 10. OTHER SYMPTOMS: "Has there been any vomiting, diarrhea, constipation, or urine problems?"       No  Protocols used: ABDOMINAL PAIN - MALE-A-AH

## 2018-09-24 NOTE — Telephone Encounter (Signed)
Virtual visit scheduled.  

## 2018-09-25 NOTE — Progress Notes (Deleted)
[place] at [facility] [address] [place], [place] [address] [phone]  Date:  09/27/2018   Name:  Luke Vasquez   DOB:  November 21, 1965   MRN:  [id number]  PCP:  Shelda Pal, DO    Chief Complaint: No chief complaint on file.   History of Present Illness:  Luke Vasquez is a 53 y.o. very pleasant male patient who presents with the following:  Luke Vasquez is a patient of my partner Dr. Nani Ravens, history of GERD, hypertension, pancreatitis. I have not met him previously He has history of kidney stones, see ER note from 49/22:  53 year old male presenting with sudden onset suprapubic and flank pain. History of kidney stones and prostatitis.  On exam he is uncomfortable and tender over left CVA and suprapubic region. Given IV fluids, 39m morphine, zofran. UA without blood, LE, nitrates. CMP, lipase, CBC WNL. Patient feels better after IV pain medications. Will check renal stone CT.  CT demonstrates punctate nonobstructing left renal calculus. Discussed with patient. Given small size these will likely pass on their own. Will d/c patient with zofran, norco. Toradol given prior to discharge. Reasons to return reviewed including severe pain, inability to take PO, fevers.   Patient Active Problem List   Diagnosis Date Noted  . Diarrhea 03/20/2018    Past Medical History:  Diagnosis Date  . Depression   . GERD (gastroesophageal reflux disease)   . History of heart attack 2013  . Hypertension   . Pancreatitis   . Seasonal allergies     Past Surgical History:  Procedure Laterality Date  . CORONARY STENT PLACEMENT  2013  . LITHOTRIPSY      Social History   Tobacco Use  . Smoking status: Former SResearch scientist (life sciences) . Smokeless tobacco: Never Used  Substance Use Topics  . Alcohol use: Yes    Comment: socially  . Drug use: No    Family History  Problem Relation Age of Onset  . Hypertension Mother   . Hypertension  Father   . High blood pressure Brother     No Known Allergies  Medication list has been reviewed and updated.  Current Outpatient Medications on File Prior to Visit  Medication Sig Dispense Refill  . aspirin EC 81 MG tablet Take 81 mg by mouth daily.    . cetirizine (ZYRTEC) 10 MG tablet Take 1 tablet (10 mg total) by mouth daily. 30 tablet 2  . cyclobenzaprine (FLEXERIL) 5 MG tablet Take 1 tablet (5 mg total) by mouth 3 (three) times daily as needed for muscle spasms. 30 tablet 1  . dicyclomine (BENTYL) 20 MG tablet Take 1 tablet (20 mg total) by mouth 3 (three) times daily before meals. 30 tablet 0  . famotidine (PEPCID) 20 MG tablet Take 20 mg by mouth 2 (two) times daily.  0  . fluticasone (FLONASE) 50 MCG/ACT nasal spray Place 2 sprays into both nostrils daily. 16 g 2  . lisinopril (PRINIVIL,ZESTRIL) 10 MG tablet Take 10 mg by mouth daily.    . meloxicam (MOBIC) 15 MG tablet Take 1 tablet (15 mg total) by mouth daily. 30 tablet 0  . montelukast (SINGULAIR) 10 MG tablet Take 1 tablet (10 mg total) by mouth at bedtime. 90 tablet 3  . omeprazole (PRILOSEC) 20 MG capsule TAKE ONE CAPSULE BY MOUTH TWICE A DAY BEFORE MEAL(S) 60 capsule 1  . rosuvastatin (CRESTOR) 20 MG tablet TAKE ONE TABLET BY MOUTH  ONE TIME DAILY 30 tablet 0  . traMADol (ULTRAM) 50 MG tablet Take 1 tablet (50 mg total) by mouth every 8 (eight) hours as needed for moderate pain. 15 tablet 0   No current facility-administered medications on file prior to visit.     Review of Systems:  As per HPI- otherwise negative.   Physical Examination: There were no vitals filed for this visit. There were no vitals filed for this visit. There is no height or weight on file to calculate BMI. Ideal Body Weight:    GEN: WDWN, NAD, Non-toxic, A & O x 3 HEENT: Atraumatic, Normocephalic. Neck supple. No masses, No LAD. Ears and Nose: No external deformity. CV: RRR, No M/G/R. No JVD. No thrill. No extra heart sounds. PULM: CTA B,  no wheezes, crackles, rhonchi. No retractions. No resp. distress. No accessory muscle use. ABD: S, NT, ND, +BS. No rebound. No HSM. EXTR: No c/c/e NEURO Normal gait.  PSYCH: Normally interactive. Conversant. Not depressed or anxious appearing.  Calm demeanor.    Assessment and Plan: ***  Signed Lamar Blinks, MD

## 2018-09-27 ENCOUNTER — Ambulatory Visit: Payer: 59 | Admitting: Family Medicine

## 2018-09-27 ENCOUNTER — Emergency Department (HOSPITAL_BASED_OUTPATIENT_CLINIC_OR_DEPARTMENT_OTHER)
Admission: EM | Admit: 2018-09-27 | Discharge: 2018-09-27 | Disposition: A | Discharge status: Home / Self Care | Payer: 59 | Attending: Emergency Medicine | Admitting: Emergency Medicine

## 2018-09-27 ENCOUNTER — Emergency Department (HOSPITAL_BASED_OUTPATIENT_CLINIC_OR_DEPARTMENT_OTHER): Payer: 59

## 2018-09-27 ENCOUNTER — Ambulatory Visit: Payer: PRIVATE HEALTH INSURANCE | Admitting: Family Medicine

## 2018-09-27 ENCOUNTER — Encounter (HOSPITAL_BASED_OUTPATIENT_CLINIC_OR_DEPARTMENT_OTHER): Payer: Self-pay | Admitting: *Deleted

## 2018-09-27 ENCOUNTER — Other Ambulatory Visit: Payer: Self-pay

## 2018-09-27 ENCOUNTER — Other Ambulatory Visit: Payer: Self-pay | Admitting: Family Medicine

## 2018-09-27 VITALS — BP 130/80 | HR 91 | Temp 98.2°F | Ht 70.0 in | Wt 232.0 lb

## 2018-09-27 DIAGNOSIS — Z79899 Other long term (current) drug therapy: Secondary | ICD-10-CM | POA: Insufficient documentation

## 2018-09-27 DIAGNOSIS — Z87891 Personal history of nicotine dependence: Secondary | ICD-10-CM | POA: Diagnosis not present

## 2018-09-27 DIAGNOSIS — R1013 Epigastric pain: Secondary | ICD-10-CM

## 2018-09-27 DIAGNOSIS — Z1211 Encounter for screening for malignant neoplasm of colon: Secondary | ICD-10-CM

## 2018-09-27 DIAGNOSIS — R109 Unspecified abdominal pain: Secondary | ICD-10-CM | POA: Diagnosis present

## 2018-09-27 DIAGNOSIS — R1084 Generalized abdominal pain: Secondary | ICD-10-CM

## 2018-09-27 DIAGNOSIS — R300 Dysuria: Secondary | ICD-10-CM | POA: Diagnosis not present

## 2018-09-27 DIAGNOSIS — Z7982 Long term (current) use of aspirin: Secondary | ICD-10-CM | POA: Insufficient documentation

## 2018-09-27 DIAGNOSIS — I259 Chronic ischemic heart disease, unspecified: Secondary | ICD-10-CM | POA: Diagnosis not present

## 2018-09-27 DIAGNOSIS — I10 Essential (primary) hypertension: Secondary | ICD-10-CM | POA: Insufficient documentation

## 2018-09-27 LAB — COMPREHENSIVE METABOLIC PANEL
ALT: 34 U/L (ref 0–44)
AST: 27 U/L (ref 15–41)
Albumin: 4.4 g/dL (ref 3.5–5.0)
Alkaline Phosphatase: 38 U/L (ref 38–126)
Anion gap: 9 (ref 5–15)
BUN: 24 mg/dL — ABNORMAL HIGH (ref 6–20)
CO2: 21 mmol/L — ABNORMAL LOW (ref 22–32)
Calcium: 8.9 mg/dL (ref 8.9–10.3)
Chloride: 110 mmol/L (ref 98–111)
Creatinine, Ser: 1.23 mg/dL (ref 0.61–1.24)
GFR calc Af Amer: >60 mL/min (ref >60)
GFR calc non Af Amer: >60 mL/min (ref >60)
Glucose, Bld: 96 mg/dL (ref 70–99)
Potassium: 3.9 mmol/L (ref 3.5–5.1)
Sodium: 140 mmol/L (ref 135–145)
Total Bilirubin: 0.8 mg/dL (ref 0.3–1.2)
Total Protein: 6.9 g/dL (ref 6.5–8.1)

## 2018-09-27 LAB — CBC WITH DIFFERENTIAL/PLATELET
Abs Immature Granulocytes: 0.04 10*3/uL (ref 0.00–0.07)
Basophils Absolute: 0 10*3/uL (ref 0.0–0.1)
Basophils Relative: 0 %
Eosinophils Absolute: 0.2 10*3/uL (ref 0.0–0.5)
Eosinophils Relative: 2 %
HCT: 46.7 % (ref 39.0–52.0)
Hemoglobin: 16.1 g/dL (ref 13.0–17.0)
Immature Granulocytes: 1 %
Lymphocytes Relative: 15 %
Lymphs Abs: 1.2 10*3/uL (ref 0.7–4.0)
MCH: 31.4 pg (ref 26.0–34.0)
MCHC: 34.5 g/dL (ref 30.0–36.0)
MCV: 91 fL (ref 80.0–100.0)
Monocytes Absolute: 0.8 10*3/uL (ref 0.1–1.0)
Monocytes Relative: 10 %
Neutro Abs: 6.1 10*3/uL (ref 1.7–7.7)
Neutrophils Relative %: 72 %
Platelets: 169 10*3/uL (ref 150–400)
RBC: 5.13 MIL/uL (ref 4.22–5.81)
RDW: 12.6 % (ref 11.5–15.5)
WBC: 8.4 10*3/uL (ref 4.0–10.5)
nRBC: 0 % (ref 0.0–0.2)

## 2018-09-27 LAB — POCT URINALYSIS DIP (MANUAL ENTRY)
Bilirubin, UA: NEGATIVE
Blood, UA: NEGATIVE
Glucose, UA: NEGATIVE mg/dL
Leukocytes, UA: NEGATIVE
Nitrite, UA: NEGATIVE
Protein Ur, POC: NEGATIVE mg/dL
Spec Grav, UA: >=1.03 — AB (ref 1.010–1.025)
Urobilinogen, UA: 1 E.U./dL
pH, UA: 5.5 (ref 5.0–8.0)

## 2018-09-27 LAB — LIPASE, BLOOD: Lipase: 31 U/L (ref 11–51)

## 2018-09-27 MED ORDER — SODIUM CHLORIDE 0.9 % IV BOLUS
1000.0000 mL | Freq: Once | INTRAVENOUS | Status: AC
  Administered 2018-09-27: 1000 mL via INTRAVENOUS

## 2018-09-27 MED ORDER — ACETAMINOPHEN 325 MG PO TABS
650.0000 mg | ORAL_TABLET | Freq: Once | ORAL | Status: AC
  Administered 2018-09-27: 650 mg via ORAL
  Filled 2018-09-27: qty 2

## 2018-09-27 MED ORDER — IOHEXOL 300 MG/ML  SOLN
100.0000 mL | Freq: Once | INTRAMUSCULAR | Status: AC | PRN
  Administered 2018-09-27: 100 mL via INTRAVENOUS

## 2018-09-27 MED ORDER — MORPHINE SULFATE (PF) 2 MG/ML IV SOLN: 2.0000 mg | Freq: Once | INTRAVENOUS | Status: DC | PRN

## 2018-09-27 NOTE — ED Triage Notes (Signed)
Lower abdominal and back pain for a week. He was sent from his MD's office for further evaluation.

## 2018-09-27 NOTE — ED Provider Notes (Signed)
[place] EMERGENCY DEPARTMENT Provider Note   CSN: [id number] Arrival date & time: 09/27/18  1458   History   Chief Complaint Chief Complaint  Patient presents with   Abdominal Pain    HPI Luke Vasquez is a 53 y.o. male with PMH significant for h/o kidney stones with lithotripsy, CAD with stent placement in 40s, GERD, HTN, pancreatitis, seasonal allergies, diverticulosis here for progressively increased abdominal pain. Patient states his pain has been off and on for the past year with progressive worsening recently. On Tuesday he woke up around 0400 with nausea and watery, nonbloody, and non-mucous diarrhea. While he was on the toilet he noted he lost consciousness and woke up with his hands on his knees and head down. He was seen by his PCP today, U/A at that time negative for infection or hematuria. States pain started in bilateral lower quadrants and suprapubic area and then spread diffusely. Pain is now epigastric, suprapubic and bilateral flanks. He denies sick contacts, travel, vomiting, fevers. He has had some decrease in appetite, reports abdominal pain hurts worse with eating. Also reporting dysuria recently. Denies new, suspicious or raw foods. 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    Past Medical History:  Diagnosis Date   Depression    GERD (gastroesophageal reflux disease)    History of heart attack 2013   Hypertension    Pancreatitis    Seasonal allergies     Patient Active Problem List   Diagnosis Date Noted   Diarrhea 03/20/2018    Past Surgical History:  Procedure Laterality Date   CORONARY STENT PLACEMENT  2013   LITHOTRIPSY       Home Medications    Prior to Admission medications   Medication Sig Start Date End Date Taking? Authorizing Provider  aspirin EC 81 MG tablet Take 81 mg by mouth daily.   Yes [provider]  cetirizine (ZYRTEC) 10 MG tablet Take 1 tablet (10 mg total) by mouth daily. 10/10/17  Yes Shelda Pal, DO  famotidine (PEPCID) 20 MG tablet Take 20 mg by mouth 2 (two) times daily. 11/02/17  Yes [provider]  fluticasone (FLONASE) 50 MCG/ACT nasal spray Place 2 sprays into both nostrils daily. 07/19/18  Yes Shelda Pal, DO  lisinopril (PRINIVIL,ZESTRIL) 10 MG tablet Take 10 mg by mouth daily.   Yes [provider]  montelukast (SINGULAIR) 10 MG tablet Take 1 tablet (10 mg total) by mouth at bedtime. 09/11/18  Yes Shelda Pal, DO  omeprazole (PRILOSEC) 20 MG capsule TAKE ONE CAPSULE BY MOUTH TWICE A DAY BEFORE MEAL(S) 06/25/18  Yes Shelda Pal, DO  rosuvastatin (Morrison) 20 MG tablet TAKE ONE TABLET BY MOUTH ONE TIME DAILY 06/25/18  Yes Shelda Pal, DO    Family History Family History  Problem Relation Age of Onset   Hypertension Mother    Hypertension Father    High blood pressure Brother     Social History Social History   Tobacco Use   Smoking status: Former Smoker   Smokeless tobacco: Never Used  Substance Use Topics   Alcohol use: Yes    Comment: socially   Drug use: No     Allergies   Patient has no known allergies.   Review of Systems Review of Systems  Constitutional: Negative for fever.  HENT: Positive for congestion. Negative for sore throat.   Eyes: Negative for visual disturbance.  Respiratory: Positive for cough. Negative for shortness of breath.   Cardiovascular: Negative  for chest pain and palpitations.  Gastrointestinal: Positive for abdominal distention, abdominal pain, diarrhea and nausea. Negative for blood in stool and vomiting.  Endocrine: Negative for polydipsia.  Genitourinary: Positive for decreased urine volume, dysuria and flank pain. Negative for difficulty urinating, discharge and hematuria.  Skin: Negative for rash.  Allergic/Immunologic: Positive for environmental allergies. Negative for food allergies.  Neurological: Negative for dizziness, light-headedness and  headaches.  Psychiatric/Behavioral: Negative for confusion.    Physical Exam Updated Vital Signs BP (!) 128/93 (BP Location: Right Arm)    Pulse 77    Temp 98.1 F (36.7 C) (Oral)    Resp 16    Ht 5\' 10"  (1.778 m)    Wt 104.3 kg    SpO2 98%    BMI 33.00 kg/m   Physical Exam Vitals signs and nursing note reviewed.  Constitutional:      Appearance: He is well-developed. He is not ill-appearing or diaphoretic.  HENT:     Mouth/Throat:     Mouth: Mucous membranes are moist.     Pharynx: Oropharynx is clear.  Cardiovascular:     Rate and Rhythm: Normal rate and regular rhythm.     Heart sounds: Normal heart sounds. No murmur.  Pulmonary:     Effort: Pulmonary effort is normal. No respiratory distress.     Breath sounds: Normal breath sounds. No stridor. No wheezing, rhonchi or rales.  Chest:     Chest wall: No tenderness.  Abdominal:     General: Bowel sounds are decreased. There is no distension.     Palpations: Abdomen is soft. There is no fluid wave, hepatomegaly, splenomegaly, mass or pulsatile mass.     Tenderness: There is abdominal tenderness in the epigastric area and suprapubic area. There is no right CVA tenderness, left CVA tenderness, guarding or rebound. Negative signs include Murphy's sign.     Hernia: No hernia is present.  Skin:    General: Skin is warm and dry.     Findings: No rash.  Neurological:     Mental Status: He is alert and oriented to person, place, and time.  Psychiatric:        Mood and Affect: Mood normal.    ED Treatments / Results  Labs (all labs ordered are listed, but only abnormal results are displayed) Labs Reviewed  COMPREHENSIVE METABOLIC PANEL - Abnormal; Notable for the following components:      Result Value   CO2 21 (*)    BUN 24 (*)    All other components within normal limits  CBC WITH DIFFERENTIAL/PLATELET  LIPASE, BLOOD    EKG None  Radiology Ct Abdomen Pelvis W Contrast  Result Date: 09/27/2018 CLINICAL DATA:   Intermittent abdominal pain for 10 days, diarrhea, syncopal episode during bowel movement 2 days ago, history hypertension, pancreatitis, former smoker EXAM: CT ABDOMEN AND PELVIS WITH CONTRAST TECHNIQUE: Multidetector CT imaging of the abdomen and pelvis was performed using the standard protocol following bolus administration of intravenous contrast. Sagittal and coronal MPR images reconstructed from axial data set. CONTRAST:  145mL OMNIPAQUE IOHEXOL 300 MG/ML SOLN IV. Dilute oral contrast. COMPARISON:  03/22/2018 FINDINGS: Lower chest: Minimal scarring medial RIGHT lower lobe. Hepatobiliary: Gallbladder and liver normal appearance Pancreas: Normal appearance Spleen: Normal appearance Adrenals/Urinary Tract: Adrenal glands normal appearance. BILATERAL renal cysts, largest at inferior pole RIGHT kidney, 7.5 x 7.0 cm. No worrisome masses, hydronephrosis or hydroureter. No urinary tract calcification. Bladder and ureters unremarkable. Stomach/Bowel: Normal appendix. Stomach and bowel loops normal appearance Vascular/Lymphatic:  Vascular structures patent. Atherosclerotic calcifications of aorta, iliac arteries, coronary arteries. Aorta normal caliber. No adenopathy. Reproductive: Unremarkable prostate gland Other: No free air or free fluid. Tiny umbilical hernia containing fat. No acute inflammatory process. Musculoskeletal: Unremarkable IMPRESSION: BILATERAL renal cysts. No acute intra-abdominal or intrapelvic abnormalities. Tiny umbilical hernia containing fat. Electronically Signed   By: Lavonia Dana M.D.   On: 09/27/2018 17:14    Procedures Procedures (including critical care time)  Medications Ordered in ED Medications  morphine 2 MG/ML injection 2 mg (2 mg Intravenous Not Given 09/27/18 1642)  sodium chloride 0.9 % bolus 1,000 mL ( Intravenous Stopped 09/27/18 1647)  iohexol (OMNIPAQUE) 300 MG/ML solution 100 mL (100 mLs Intravenous Contrast Given 09/27/18 1649)  acetaminophen (TYLENOL) tablet 650 mg (650  mg Oral Given 09/27/18 1857)    Initial Impression / Assessment and Plan / ED Course  I have reviewed the triage vital signs and the nursing notes.  Pertinent labs & imaging results that were available during my care of the patient were reviewed by me and considered in my medical decision making (see chart for details).   89 - 85yo M with h/o pancreatitis, renal stones, GERD, CAD s/p stent here for progressively worsening diffuse abdominal pain x1 year. Hemodynamically stable here, afebrile, and without peritoneal signs. On exam, tender to palpation in bilateral lower quadrants and suprapubically with prior U/A today negative for infection or hematuria. Prior CT scan 03/2018 with punctate L renal stone and diverticulosis, could have diverticulitis or colitis given diarrhea. Less likely infectious diarrhea given nonbloody or mucous. No CVA tenderness on exam or hematuria on UA or localized flank pain, not as likely obstructing renal stone. No chest pain or difficulties breathing to suggest PNA, MI. Does have h/o pancreatitis and reports pain with eating, will check lipase to r/o pancreatitis. Will also obtain CMP, CBC w/ diff, CT Abd/Pelvis to further evaluate. 1L NS bolus given for dehydration.   1837 - CBC wnl. Cr slightly elevated from prior, likely 2/2 dehydration as evidenced on prior U/A. Lipase nl. CT Abd/Pelvis w/ bilateral renal cysts but otherwise negative for renal stones, gallbladder pathology, colitis, diverticulitis. Vitals remain wnl.  Patient feeling slightly improved after tylenol and 1L NS bolus, amenable to discharge with PCP f/u. Strict return precautions provided.     Final Clinical Impressions(s) / ED Diagnoses   Final diagnoses:  Generalized abdominal pain    ED Discharge Orders    None       Rory Percy, DO 09/27/18 1925    Elnora Morrison, MD 09/27/18 2316

## 2018-09-27 NOTE — Progress Notes (Signed)
[place] at [facility] [address], [place], [place] [address] [phone]  Date:  09/27/2018   Name:  Luke Vasquez   DOB:  03/09/1966   MRN:  [id number]  PCP:  Shelda Pal, DO    Chief Complaint: lower abdomen pain and Flank Pain   History of Present Illness:  Luke Vasquez is a 53 y.o. very pleasant male patient who presents with the following:  Pt of Dr. Nani Ravens with history of HTN, pancreatitis, kidney stones About 10 days ago he had return of abd pain which is intermittent.  However he states that he has really not felt well since about December, which is the last time he was seen in the ER and had a CT scan.  Of note this was a noncontrasted's stone study  This episode of pain started very low in his belly - he thought it might be a kidney stone.  However the pain has moved around his belly, so he is not sure what it might be now  He had diarrhea and "passed out" due to a BM 2 days ago-he reports that he was sitting on the toilet, had a forceful bowel movement and woke up with his head between his knees. He is eating a little bit He is able to drink some water  History of pancreatitis; also lithotripsy twice, most recently in 2006 approx  No fever noted He notes a little bit of dysuria/ burning with urination  Patient Active Problem List   Diagnosis Date Noted  . Diarrhea 03/20/2018    Past Medical History:  Diagnosis Date  . Depression   . GERD (gastroesophageal reflux disease)   . History of heart attack 2013  . Hypertension   . Pancreatitis   . Seasonal allergies     Past Surgical History:  Procedure Laterality Date  . CORONARY STENT PLACEMENT  2013  . LITHOTRIPSY      Social History   Tobacco Use  . Smoking status: Former Research scientist (life sciences)  . Smokeless tobacco: Never Used  Substance Use Topics  . Alcohol use: Yes    Comment: socially  . Drug use: No    Family History  Problem  Relation Age of Onset  . Hypertension Mother   . Hypertension Father   . High blood pressure Brother     No Known Allergies  Medication list has been reviewed and updated.  Current Outpatient Medications on File Prior to Visit  Medication Sig Dispense Refill  . aspirin EC 81 MG tablet Take 81 mg by mouth daily.    . cetirizine (ZYRTEC) 10 MG tablet Take 1 tablet (10 mg total) by mouth daily. 30 tablet 2  . famotidine (PEPCID) 20 MG tablet Take 20 mg by mouth 2 (two) times daily.  0  . fluticasone (FLONASE) 50 MCG/ACT nasal spray Place 2 sprays into both nostrils daily. 16 g 2  . lisinopril (PRINIVIL,ZESTRIL) 10 MG tablet Take 10 mg by mouth daily.    . montelukast (SINGULAIR) 10 MG tablet Take 1 tablet (10 mg total) by mouth at bedtime. 90 tablet 3  . omeprazole (PRILOSEC) 20 MG capsule TAKE ONE CAPSULE BY MOUTH TWICE A DAY BEFORE MEAL(S) 60 capsule 1  . rosuvastatin (CRESTOR) 20 MG tablet TAKE ONE TABLET BY MOUTH ONE TIME DAILY 30 tablet 0   No current facility-administered medications on file prior to visit.     Review of Systems:  As per HPI-  otherwise negative.   Physical Examination: Vitals:   09/27/18 1425  BP: 130/80  Pulse: 91  Temp: 98.2 F (36.8 C)  SpO2: 94%   Vitals:   09/27/18 1425  Weight: 232 lb (105.2 kg)  Height: 5\' 10"  (1.778 m)   Body mass index is 33.29 kg/m. Ideal Body Weight: Weight in (lb) to have BMI = 25: 173.9  GEN: WDWN, NAD, Non-toxic, A & O x 3, obese, appears slightly uncomfortable  HEENT: Atraumatic, Normocephalic. Neck supple. No masses, No LAD. Ears and Nose: No external deformity. CV: RRR, No M/G/R. No JVD. No thrill. No extra heart sounds. PULM: CTA B, no wheezes, crackles, rhonchi. No retractions. No resp. distress. No accessory muscle use. ABD: S, ND, +BS. No rebound. No HSM.  He indicates tenderness in the epigastrium and suprapubic areas  EXTR: No c/c/e NEURO Normal gait.  PSYCH: Normally interactive. Conversant. Not  depressed or anxious appearing.  Calm demeanor.  No CVA tenderness    Assessment and Plan: Dysuria - Plan: Urine Culture, POCT urinalysis dipstick  Screening for colon cancer - Plan: Ambulatory referral to Gastroenterology  Epigastric abdominal pain   Follow-up: No follow-ups on file.  No orders of the defined types were placed in this encounter.  Orders Placed This Encounter  Procedures  . Urine Culture  . Ambulatory referral to Gastroenterology  . POCT urinalysis dipstick      Visit today for abdominal pain, intermittent over the last 10 days.  2 days ago he had an episode of severe diarrhea which caused him to "pass out" while on the toilet.  No vomiting, but he has decreased oral intake He has history of nephrolithiasis status post lithotripsy, and also questionable history of pancreatitis. Urine today does not suggest a kidney stone Referral to emergency department for further evaluation of acute abdominal pain.  [facility] emergency department care of this patient  Follow-up: No follow-ups on file.  No orders of the defined types were placed in this encounter.  Orders Placed This Encounter  Procedures  . Urine Culture  . Ambulatory referral to Gastroenterology  . POCT urinalysis dipstick   Results for orders placed or performed in visit on 09/27/18  POCT urinalysis dipstick  Result Value Ref Range   Color, UA yellow yellow   Clarity, UA clear clear   Glucose, UA negative negative mg/dL   Bilirubin, UA negative negative   Ketones, POC UA small (15) (A) negative mg/dL   Spec Grav, UA >=1.030 (A) 1.010 - 1.025   Blood, UA negative negative   pH, UA 5.5 5.0 - 8.0   Protein Ur, POC negative negative mg/dL   Urobilinogen, UA 1.0 0.2 or 1.0 E.U./dL   Nitrite, UA Negative Negative   Leukocytes, UA Negative Negative     Signed Lamar Blinks, MD

## 2018-09-27 NOTE — Discharge Instructions (Addendum)
You were seen today for ongoing abdominal discomfort and diarrhea. Your labs and CT scan did not show evidence of infection or intraabdominal process. You should follow up with your primary doctor early next week, they may refer you to a kidney doctor. You can take ibuprofen as needed for pain, but try not to take this more than once daily as this can worsen your kidney function. You can also take tylenol as needed for pain. You can also take imodium as needed for diarrhea. Come back to the ED if your pain suddenly worsens or you are unable to keep down food or drink.

## 2018-09-27 NOTE — Addendum Note (Signed)
Addended by: Caffie Pinto on: 09/27/2018 03:22 PM   Modules accepted: Orders

## 2018-09-27 NOTE — ED Notes (Signed)
No vomiting No diarrhea   Last diarrhea was Tuesday at 0430 am

## 2018-09-27 NOTE — Telephone Encounter (Signed)
Copied from [place] [id number]. Topic: Quick Communication - Rx Refill/Question >> Sep 27, 2018  4:21 PM Luke Vasquez, [place] A wrote: Medication: lisinopril (PRINIVIL,ZESTRIL) 10 MG tablet   Has the patient contacted their pharmacy? Yes (Agent: If no, request that the patient contact the pharmacy for the refill.) (Agent: If yes, when and what did the pharmacy advise?)Contact PCP  Preferred Pharmacy (with phone number or street name): [place], [place] - [phone] (Phone) [phone] (Fax)    Agent: Please be advised that RX refills may take up to 3 business days. We ask that you follow-up with your pharmacy.

## 2018-09-28 MED ORDER — LISINOPRIL 10 MG PO TABS: 10.0000 mg | ORAL_TABLET | Freq: Every day | ORAL | 1 refills | Status: DC

## 2018-09-28 NOTE — Addendum Note (Signed)
Addended by: Caffie Pinto on: 09/28/2018 09:33 AM   Modules accepted: Orders

## 2018-09-28 NOTE — Telephone Encounter (Signed)
Rx faxed to [facility] pharmacy  

## 2018-09-29 LAB — URINE CULTURE
MICRO NUMBER:: 564329
Result:: NO GROWTH
SPECIMEN QUALITY:: ADEQUATE

## 2018-10-03 ENCOUNTER — Telehealth: Payer: Self-pay | Admitting: Family Medicine

## 2018-10-03 NOTE — Telephone Encounter (Signed)
LVM for pt to call the office and schedule an appt since pt is wanting to schedule an appt.    Copied from [place] [id number]. Topic: Appointment Scheduling - Scheduling Inquiry for Clinic >> Oct 01, 2018 11:15 AM Sheran Luz wrote: Patient calling to schedule appointment with Dr. Nani Ravens. Attempted to contact office x3.

## 2018-10-05 ENCOUNTER — Encounter: Payer: Self-pay | Admitting: Family Medicine

## 2018-10-05 ENCOUNTER — Other Ambulatory Visit: Payer: Self-pay

## 2018-10-05 ENCOUNTER — Ambulatory Visit: Payer: 59 | Admitting: Family Medicine

## 2018-10-05 VITALS — BP 122/68 | HR 100 | Temp 98.9°F | Ht 70.0 in | Wt 233.2 lb

## 2018-10-05 DIAGNOSIS — R102 Pelvic and perineal pain: Secondary | ICD-10-CM

## 2018-10-05 DIAGNOSIS — G4700 Insomnia, unspecified: Secondary | ICD-10-CM

## 2018-10-05 MED ORDER — AMITRIPTYLINE HCL 25 MG PO TABS: 25.0000 mg | ORAL_TABLET | Freq: Every day | ORAL | 3 refills | Status: DC

## 2018-10-05 NOTE — Addendum Note (Signed)
Addended by: Ames Coupe on: 10/05/2018 04:33 PM   Modules accepted: Level of Service

## 2018-10-05 NOTE — Patient Instructions (Signed)
If you do not hear anything about your referral in the next 1-2 weeks, call our office and ask for an update.  Try Metamucil daily.  Keep an eye on which foods make your pain better or worse.  Stay hydrated.  Let us know if you need anything.

## 2018-10-05 NOTE — Progress Notes (Signed)
Chief Complaint  Patient presents with  . Abdominal Pain    10 days    Subjective: Patient is a 53 y.o. male here for follow-up abdominal pain.  Patient continues to have suprapubic abdominal pain.  He was seen by me and trialed on Bentyl on 4/28.  This did not help.  He saw the emergency department on 09/27/2018.  CT scan was unremarkable.  He denies any fevers.  This does not wake him up at night.  No unintentional weight loss, bleeding, nausea, or vomiting.  He will have loose stools that sometimes alternate with constipation.  No personal or family history of IBS, IBD, or celiac disease.  Specific food types do not seem to affect his symptoms.  Over past 4 mo, pt has not been sleeping well. +hx of depression, he was on Elavil in past. Gets 3-4 hrs nightly, affects him most nights.   ROS: Const: No fevers GI: As noted in HPI  Past Medical History:  Diagnosis Date  . Depression   . GERD (gastroesophageal reflux disease)   . History of heart attack 2013  . Hypertension   . Pancreatitis   . Seasonal allergies     Objective: BP 122/68 (BP Location: Left Arm, Patient Position: Sitting, Cuff Size: Large)   Pulse 100   Temp 98.9 F (37.2 C) (Oral)   Ht 5\' 10"  (1.778 m)   Wt 233 lb 4 oz (105.8 kg)   SpO2 96%   BMI 33.47 kg/m  General: Awake, appears stated age HEENT: MMM, EOMi Heart: RRR, no murmurs Lungs: CTAB, no rales, wheezes or rhonchi. No accessory muscle use Abd: S, TTP in suprapubic region, mildly distended, no masses or organomegaly Psych: Age appropriate judgment and insight, normal affect and mood  Assessment and Plan: Suprapubic abdominal pain - Plan: amitriptyline (ELAVIL) 25 MG tablet, Ambulatory referral to Gastroenterology, trial Metamucil, food diary.  Insomnia- TCA  Orders as above. F/u for CPE. The patient voiced understanding and agreement to the plan.  [place], DO 10/05/18  4:30 PM

## 2018-10-25 ENCOUNTER — Other Ambulatory Visit: Payer: Self-pay | Admitting: Family Medicine

## 2018-10-25 DIAGNOSIS — R1013 Epigastric pain: Secondary | ICD-10-CM

## 2018-10-25 MED ORDER — OMEPRAZOLE 20 MG PO CPDR: 20.0000 mg | DELAYED_RELEASE_CAPSULE | Freq: Two times a day (BID) | ORAL | 3 refills | Status: DC

## 2018-10-25 NOTE — Telephone Encounter (Signed)
Rx sent 

## 2018-10-25 NOTE — Telephone Encounter (Signed)
Medication Refill - Medication: omeprazole (PRILOSEC) 20 MG capsule  Refill request  Preferred Pharmacy (with phone number or street name):  [place], [place] - [address] [phone] (Phone) [phone] (Fax)

## 2018-10-26 ENCOUNTER — Other Ambulatory Visit: Payer: Self-pay

## 2018-10-26 ENCOUNTER — Encounter: Payer: Self-pay | Admitting: Gastroenterology

## 2018-10-26 ENCOUNTER — Ambulatory Visit (INDEPENDENT_AMBULATORY_CARE_PROVIDER_SITE_OTHER): Payer: 59 | Admitting: Gastroenterology

## 2018-10-26 DIAGNOSIS — K219 Gastro-esophageal reflux disease without esophagitis: Secondary | ICD-10-CM | POA: Diagnosis not present

## 2018-10-26 DIAGNOSIS — R102 Pelvic and perineal pain: Secondary | ICD-10-CM

## 2018-10-26 DIAGNOSIS — R197 Diarrhea, unspecified: Secondary | ICD-10-CM | POA: Diagnosis not present

## 2018-10-26 DIAGNOSIS — R194 Change in bowel habit: Secondary | ICD-10-CM | POA: Diagnosis not present

## 2018-10-26 MED ORDER — PEG 3350-KCL-NA BICARB-NACL 420 G PO SOLR: 4000.0000 mL | Freq: Once | ORAL | 0 refills | Status: AC

## 2018-10-26 NOTE — Progress Notes (Signed)
[facility] VISIT   Primary Care Provider Shelda Pal, [place] [address] [phone]  Referring Provider Shelda Pal, [place] [address] [place],  [place] [address] [phone]  Patient Profile: Luke Vasquez is a 53 y.o. male with a pmh significant for CAD, MDD, hypertension, GERD, seasonal allergies, chronic recurrent/intermittent abdominal pain (suprapubic region), chronic intermittent diarrhea.  The patient presents to the [facility] for an evaluation and management of problem(s) noted below:  Problem List 1. Suprapubic abdominal pain   2. Intermittent diarrhea   3. Change in bowel habits   4. Gastroesophageal reflux disease, esophagitis presence not specified      I connected with  Marene Lenz on 10/26/18. I verified that I was speaking with the correct person using two identifiers. Due to the COVID-19 Pandemic, this service was provided via telemedicine using audio/visual media. The patient was located at home. The provider was located in the office. The patient did consent to this visit and is aware of charges through their insurance as well as the limitations of evaluation and management by telemedicine. The patient was referred by primary care provider. Other persons participating in this telemedicine service were none. Time spent on visit was 45 minutes in review of the patient's chart, on discussion with the patient himself, and in coordination of care and endoscopic evaluation.   History of Present Illness This is the patient's first visit to the outpatient [place] clinic.  The patient was referred by his primary care provider because of recent issues of abdominal pain.  The patient describes that for the last 2 to 3 months he has been experiencing episodes of pain in his suprapubic region.  In the past, the patient has been told by his  primary care providers that he had IBS.  He describes a longer history of intermittent episodes of diarrhea and abdominal pain in the region similar to where this abdominal pain has been more recently.  With that being said, the patient does not recount any history that he had a significant improvement in his pain once he has a bowel movement or defecate.  He has tried a brat diet with minimal success.  However, in the course of the last 2-1/2 to 3 weeks things have improved and he no longer has pain.  His issues of diarrhea have also improved and he is once again gone back to his more normal bowel habits of having a daily bowel movement that is formed once daily.  Overall over the course the last few months and years she has had a stable weight.  He is never had an upper or lower endoscopy.  He states that at one point in time for his diagnosis of IBS-D he was prescribed medications but cannot recall what that was.  To help with diarrhea during the periods of his pain and diarrhea episodes he will take Pepto-Bismol.  He does not state that he has taken any Imodium during those periods in time.  The patient has had longstanding GERD for years and has been on omeprazole for years.  He denies any overt dysphagia or odynophagia.  He has no globus sensation.  He currently has no nausea or vomiting.  He wonders if this is just how his body works but is hopeful that there is an answer for possible diagnosis.  In the chart, there is a history of pancreatitis, but there is  no true history of pancreatitis per the patient report.  He does not take significant nonsteroidals or BC/Goody powders.  GI Review of Systems Positive as above Negative for regurgitation, epigastric pain, bloating, melena, hematochezia  Review of Systems General: Denies fevers/chills/weight loss HEENT: Denies oral lesions Cardiovascular: Denies chest pain/palpitations Pulmonary: Denies shortness of breath/nocturnal cough Gastroenterological:  See HPI Genitourinary: Denies darkened urine or hematuria or dysuria Hematological: Denies easy bruising/bleeding Endocrine: Denies temperature intolerance Dermatological: Denies jaundice Psychological: Mood is stable   Medications Current Outpatient Medications  Medication Sig Dispense Refill   amitriptyline (ELAVIL) 25 MG tablet Take 1 tablet (25 mg total) by mouth at bedtime. 30 tablet 3   aspirin EC 81 MG tablet Take 81 mg by mouth daily.     cetirizine (ZYRTEC) 10 MG tablet Take 1 tablet (10 mg total) by mouth daily. 30 tablet 2   famotidine (PEPCID) 20 MG tablet Take 20 mg by mouth 2 (two) times daily.  0   fluticasone (FLONASE) 50 MCG/ACT nasal spray Place 2 sprays into both nostrils daily. 16 g 2   lisinopril (ZESTRIL) 10 MG tablet Take 1 tablet (10 mg total) by mouth daily. 90 tablet 1   montelukast (SINGULAIR) 10 MG tablet Take 1 tablet (10 mg total) by mouth at bedtime. 90 tablet 3   omeprazole (PRILOSEC) 20 MG capsule Take 1 capsule (20 mg total) by mouth 2 (two) times daily before a meal. 180 capsule 3   rosuvastatin (CRESTOR) 20 MG tablet TAKE ONE TABLET BY MOUTH ONE TIME DAILY 30 tablet 0   No current facility-administered medications for this visit.     Allergies No Known Allergies  Histories Past Medical History:  Diagnosis Date   Depression    GERD (gastroesophageal reflux disease)    History of heart attack 2013   Hypertension    Seasonal allergies    Past Surgical History:  Procedure Laterality Date   CORONARY STENT PLACEMENT  2013   LITHOTRIPSY     Social History   Socioeconomic History   Marital status: Single    Spouse name: Not on file   Number of children: Not on file   Years of education: Not on file   Highest education level: Not on file  Occupational History   Not on file  Social Needs   Financial resource strain: Not on file   Food insecurity    Worry: Not on file    Inability: Not on file   Transportation  needs    Medical: Not on file    Non-medical: Not on file  Tobacco Use   Smoking status: Former Smoker   Smokeless tobacco: Never Used  Substance and Sexual Activity   Alcohol use: Yes    Comment: socially   Drug use: No   Sexual activity: Not on file  Lifestyle   Physical activity    Days per week: Not on file    Minutes per session: Not on file   Stress: Not on file  Relationships   Social connections    Talks on phone: Not on file    Gets together: Not on file    Attends religious service: Not on file    Active member of club or organization: Not on file    Attends meetings of clubs or organizations: Not on file    Relationship status: Not on file   Intimate partner violence    Fear of current or ex partner: Not on file    Emotionally abused: Not  on file    Physically abused: Not on file    Forced sexual activity: Not on file  Other Topics Concern   Not on file  Social History Narrative   Not on file   Family History  Problem Relation Age of Onset   Hypertension Mother    Gallbladder disease Mother    Hypertension Father    High blood pressure Brother    Colon cancer Neg Hx    Esophageal cancer Neg Hx    Inflammatory bowel disease Neg Hx    Liver disease Neg Hx    Pancreatic cancer Neg Hx    Rectal cancer Neg Hx    Stomach cancer Neg Hx    I have reviewed his medical, social, and family history in detail and updated the electronic medical record as necessary.    PHYSICAL EXAMINATION  Telemedicine visit Abdominal exam shows no overt evidence of a large ventral hernia though limitations by the patient's holding of his phone   REVIEW OF DATA  I reviewed the following data at the time of this encounter:  GI Procedures and Studies  No relevant studies to review  Laboratory Studies  Reviewed in epic  Imaging Studies  June 2020 CT scan with contrast abdomen/pelvis IMPRESSION: BILATERAL renal cysts. No acute intra-abdominal or  intrapelvic abnormalities. Tiny umbilical hernia containing fat.   ASSESSMENT  Mr. Hetzer is a 53 y.o. male with a pmh significant for CAD, MDD, hypertension, GERD, seasonal allergies, chronic recurrent/intermittent abdominal pain (suprapubic region), chronic intermittent diarrhea.  The patient is seen today for evaluation and management of:  1. Suprapubic abdominal pain   2. Intermittent diarrhea   3. Change in bowel habits   4. Gastroesophageal reflux disease, esophagitis presence not specified    The patient is clinically and hemodynamically stable at this point in time.  Overall, his issues over the course the last few months have actually abated at this point.  This is good news.  His history from a clinical perspective does suggest the possibility of a functional diarrhea however the abdominal pain and region and location is not consistent with one that I would think would normally be IBS-D.  I do think it is reasonable because of the longer standing history and the recurrence of issues I think an endoscopic evaluation is reasonable and he is also due for colon cancer screening.  At the time of his colonoscopy I would plan to try and intubate the TI and take biopsies as well as take biopsies of the colon to rule out microscopic/collagenous colitis and inflammatory bowel disease.  As he has had longstanding heartburn and is Caucasian and over the age of 55 and to further work-up his abdominal pain, though low pretest probability of a gastric or duodenal issue in regards to the pain location, I think is worthwhile to rule out and screen for Barrett's esophagus and esophagitis issues as well.  We will plan a diagnostic upper and lower endoscopy for the patient.  Further work-up in regards to laboratories and ruling out endocrinologic issues for his intermittent episodes of diarrhea and pain are reasonable.  I would also like to obtain laboratory markers as a nonspecific marker as to whether the  patient may have other underlying inflammatory processes occurring.  We are planning on holding on any medications at this point but will determine need based on findings moving forward.  The risks and benefits of endoscopic evaluation were discussed with the patient; these include but are not limited  to the risk of perforation, infection, bleeding, missed lesions, lack of diagnosis, severe illness requiring hospitalization, as well as anesthesia and sedation related illnesses.  The patient is agreeable to proceed.  All patient questions were answered, to the best of my ability, and the patient agrees to the aforementioned plan of action with follow-up as indicated.   PLAN  Laboratories as outlined below Perform abdominal exam at time of procedures since telemedicine visit did not allow to ensure he does not have a large ventral hernia Umbilical hernia unlikely to be causing issues for him but may consider role of surgical evaluation down the road Diagnostic upper endoscopy to evaluate for Barrett's and esophagitis Screening colonoscopy to be performed and biopsies to be obtained because of intermittent nature of diarrhea and pain May consider role of antispasmodic in future to have on hold if necessary Fiber supplementation once daily FiberCon to be added   Orders Placed This Encounter  Procedures   TSH   Cortisol   Sedimentation rate   CRP High sensitivity   IgA   Tissue transglutaminase, IgA   Ambulatory referral to Gastroenterology    New Prescriptions   No medications on file   Modified Medications   No medications on file    Planned Follow Up No follow-ups on file.   Justice Britain, MD [facility] Advanced [facility]oscopy Office # [phone]

## 2018-10-26 NOTE — Patient Instructions (Addendum)
Your provider has requested that you go to the basement level for lab work anytime between 7:30-4:00pm .Press "B" on the elevator. The lab is located at the first door on the left as you exit the elevator.  You have been scheduled for an endoscopy and colonoscopy. Please follow the written instructions given to you at your visit today. Please pick up your prep supplies at the pharmacy within the next 1-3 days. If you use inhalers (even only as needed), please bring them with you on the day of your procedure.  Thank you for choosing me and [facility].  Dr. Rush Landmark

## 2018-10-27 ENCOUNTER — Encounter: Payer: Self-pay | Admitting: Gastroenterology

## 2018-10-27 DIAGNOSIS — R197 Diarrhea, unspecified: Secondary | ICD-10-CM | POA: Insufficient documentation

## 2018-10-27 DIAGNOSIS — R102 Pelvic and perineal pain: Secondary | ICD-10-CM | POA: Insufficient documentation

## 2018-10-27 DIAGNOSIS — R194 Change in bowel habit: Secondary | ICD-10-CM | POA: Insufficient documentation

## 2018-10-27 DIAGNOSIS — R1024 Suprapubic pain: Secondary | ICD-10-CM | POA: Insufficient documentation

## 2018-10-27 DIAGNOSIS — K219 Gastro-esophageal reflux disease without esophagitis: Secondary | ICD-10-CM | POA: Insufficient documentation

## 2018-11-05 ENCOUNTER — Other Ambulatory Visit: Payer: Self-pay

## 2018-11-05 ENCOUNTER — Encounter: Payer: Self-pay | Admitting: Family Medicine

## 2018-11-05 ENCOUNTER — Ambulatory Visit: Payer: 59 | Admitting: Family Medicine

## 2018-11-05 VITALS — BP 118/84 | HR 135 | Temp 98.8°F | Ht 70.0 in | Wt 232.0 lb

## 2018-11-05 DIAGNOSIS — M542 Cervicalgia: Secondary | ICD-10-CM

## 2018-11-05 MED ORDER — TIZANIDINE HCL 4 MG PO CAPS: 4.0000 mg | ORAL_CAPSULE | Freq: Three times a day (TID) | ORAL | 0 refills | Status: DC

## 2018-11-05 MED ORDER — KETOROLAC TROMETHAMINE 60 MG/2ML IM SOLN
60.0000 mg | Freq: Once | INTRAMUSCULAR | Status: AC
  Administered 2018-11-05: 10:00:00 60 mg via INTRAMUSCULAR

## 2018-11-05 MED ORDER — MELOXICAM 15 MG PO TABS: 15.0000 mg | ORAL_TABLET | Freq: Every day | ORAL | 0 refills | Status: DC

## 2018-11-05 NOTE — Patient Instructions (Addendum)
Ice/cold pack over area for 10-15 min twice daily.  Heat (pad or rice pillow in microwave) over affected area, 10-15 minutes twice daily.   OK to take Tylenol 1000 mg (2 extra strength tabs) or 975 mg (3 regular strength tabs) every 6 hours as needed.  Stop Prednisone. No anti-inflammatories for rest of the day.   Trapezius stretches/exercises Do exercises exactly as told by your health care provider and adjust them as directed. It is normal to feel mild stretching, pulling, tightness, or discomfort as you do these exercises, but you should stop right away if you feel sudden pain or your pain gets worse.  Stretching and range of motion exercises These exercises warm up your muscles and joints and improve the movement and flexibility of your shoulder. These exercises can also help to relieve pain, numbness, and tingling. If you are unable to do any of the following for any reason, do not further attempt to do it.   Exercise A: Flexion, standing    1. Stand and hold a broomstick, a cane, or a similar object. Place your hands a little more than shoulder-width apart on the object. Your left / right hand should be palm-up, and your other hand should be palm-down. 2. Push the stick to raise your left / right arm out to your side and then over your head. Use your other hand to help move the stick. Stop when you feel a stretch in your shoulder, or when you reach the angle that is recommended by your health care provider. ? Avoid shrugging your shoulder while you raise your arm. Keep your shoulder blade tucked down toward your spine. 3. Hold for 30 seconds. 4. Slowly return to the starting position. Repeat 2 times. Complete this exercise 3 times per week.  Exercise B: Abduction, supine    1. Lie on your back and hold a broomstick, a cane, or a similar object. Place your hands a little more than shoulder-width apart on the object. Your left / right hand should be palm-up, and your other hand  should be palm-down. 2. Push the stick to raise your left / right arm out to your side and then over your head. Use your other hand to help move the stick. Stop when you feel a stretch in your shoulder, or when you reach the angle that is recommended by your health care provider. ? Avoid shrugging your shoulder while you raise your arm. Keep your shoulder blade tucked down toward your spine. 3. Hold for 30 seconds. 4. Slowly return to the starting position. Repeat 2 times. Complete this exercise 3 times per week.  Exercise C: Flexion, active-assisted    1. Lie on your back. You may bend your knees for comfort. 2. Hold a broomstick, a cane, or a similar object. Place your hands about shoulder-width apart on the object. Your palms should face toward your feet. 3. Raise the stick and move your arms over your head and behind your head, toward the floor. Use your healthy arm to help your left / right arm move farther. Stop when you feel a gentle stretch in your shoulder, or when you reach the angle where your health care provider tells you to stop. 4. Hold for 30 seconds. 5. Slowly return to the starting position. Repeat 2 times. Complete this exercise 3 times per week.  Exercise D: External rotation and abduction    1. Stand in a door frame with one of your feet slightly in front of the other.  This is called a staggered stance. 2. Choose one of the following positions as told by your health care provider: ? Place your hands and forearms on the door frame above your head. ? Place your hands and forearms on the door frame at the height of your head. ? Place your hands on the door frame at the height of your elbows. 3. Slowly move your weight onto your front foot until you feel a stretch across your chest and in the front of your shoulders. Keep your head and chest upright and keep your abdominal muscles tight. 4. Hold for 30 seconds. 5. To release the stretch, shift your weight to your back  foot. Repeat 2 times. Complete this stretch 3 times per week.  Strengthening exercises These exercises build strength and endurance in your shoulder. Endurance is the ability to use your muscles for a long time, even after your muscles get tired. Exercise E: Scapular depression and adduction  1. Sit on a stable chair. Support your arms in front of you with pillows, armrests, or a tabletop. Keep your elbows in line with the sides of your body. 2. Gently move your shoulder blades down toward your middle back. Relax the muscles on the tops of your shoulders and in the back of your neck. 3. Hold for 3 seconds. 4. Slowly release the tension and relax your muscles completely before doing this exercise again. Repeat for a total of 10 repetitions. 5. After you have practiced this exercise, try doing the exercise without the arm support. Then, try the exercise while standing instead of sitting. Repeat 2 times. Complete this exercise 3 times per week.  Exercise F: Shoulder abduction, isometric    1. Stand or sit about 4-6 inches (10-15 cm) from a wall with your left / right side facing the wall. 2. Bend your left / right elbow and gently press your elbow against the wall. 3. Increase the pressure slowly until you are pressing as hard as you can without shrugging your shoulder. 4. Hold for 3 seconds. 5. Slowly release the tension and relax your muscles completely. Repeat for a total of 10 repetitions. Repeat 2 times. Complete this exercise 3 times per week.  Exercise G: Shoulder flexion, isometric    1. Stand or sit about 4-6 inches (10-15 cm) away from a wall with your left / right side facing the wall. 2. Keep your left / right elbow straight and gently press the top of your fist against the wall. Increase the pressure slowly until you are pressing as hard as you can without shrugging your shoulder. 3. Hold for 10-15 seconds. 4. Slowly release the tension and relax your muscles completely.  Repeat for a total of 10 repetitions. Repeat 2 times. Complete this exercise 3 times per week.  Exercise H: Internal rotation    1. Sit in a stable chair without armrests, or stand. Secure an exercise band at your left / right side, at elbow height. 2. Place a soft object, such as a folded towel or a small pillow, under your left / right upper arm so your elbow is a few inches (about 8 cm) away from your side. 3. Hold the end of the exercise band so the band stretches. 4. Keeping your elbow pressed against the soft object under your arm, move your forearm across your body toward your abdomen. Keep your body steady so the movement is only coming from your shoulder. 5. Hold for 3 seconds. 6. Slowly return to the starting  position. Repeat for a total of 10 repetitions. Repeat 2 times. Complete this exercise 3 times per week.  Exercise I: External rotation    1. Sit in a stable chair without armrests, or stand. 2. Secure an exercise band at your left / right side, at elbow height. 3. Place a soft object, such as a folded towel or a small pillow, under your left / right upper arm so your elbow is a few inches (about 8 cm) away from your side. 4. Hold the end of the exercise band so the band stretches. 5. Keeping your elbow pressed against the soft object under your arm, move your forearm out, away from your abdomen. Keep your body steady so the movement is only coming from your shoulder. 6. Hold for 3 seconds. 7. Slowly return to the starting position. Repeat for a total of 10 repetitions. Repeat 2 times. Complete this exercise 3 times per week. Exercise J: Shoulder extension  1. Sit in a stable chair without armrests, or stand. Secure an exercise band to a stable object in front of you so the band is at shoulder height. 2. Hold one end of the exercise band in each hand. Your palms should face each other. 3. Straighten your elbows and lift your hands up to shoulder height. 4. Step back,  away from the secured end of the exercise band, until the band stretches. 5. Squeeze your shoulder blades together and pull your hands down to the sides of your thighs. Stop when your hands are straight down by your sides. Do not let your hands go behind your body. 6. Hold for 3 seconds. 7. Slowly return to the starting position. Repeat for a total of 10 repetitions. Repeat 2 times. Complete this exercise 3 times per week.  Exercise K: Shoulder extension, prone    1. Lie on your abdomen on a firm surface so your left / right arm hangs over the edge. 2. Hold a 5 lb weight in your hand so your palm faces in toward your body. Your arm should be straight. 3. Squeeze your shoulder blade down toward the middle of your back. 4. Slowly raise your arm behind you, up to the height of the surface that you are lying on. Keep your arm straight. 5. Hold for 3 seconds. 6. Slowly return to the starting position and relax your muscles. Repeat for a total of 10 repetitions. Repeat 2 times. Complete this exercise 3 times per week.   Exercise L: Horizontal abduction, prone  1. Lie on your abdomen on a firm surface so your left / right arm hangs over the edge. 2. Hold a 5 lb weight in your hand so your palm faces toward your feet. Your arm should be straight. 3. Squeeze your shoulder blade down toward the middle of your back. 4. Bend your elbow so your hand moves up, until your elbow is bent to an "L" shape (90 degrees). With your elbow bent, slowly move your forearm forward and up. Raise your hand up to the height of the surface that you are lying on. ? Your upper arm should not move, and your elbow should stay bent. ? At the top of the movement, your palm should face the floor. 5. Hold for 3 seconds. 6. Slowly return to the starting position and relax your muscles. Repeat for a total of 10 repetitions. Repeat 2 times. Complete this exercise 3 times per week.  Exercise M: Horizontal abduction, standing   1. Sit on a stable  chair, or stand. 2. Secure an exercise band to a stable object in front of you so the band is at shoulder height. 3. Hold one end of the exercise band in each hand. 4. Straighten your elbows and lift your hands straight in front of you, up to shoulder height. Your palms should face down, toward the floor. 5. Step back, away from the secured end of the exercise band, until the band stretches. 6. Move your arms out to your sides, and keep your arms straight. 7. Hold for 3 seconds. 8. Slowly return to the starting position. Repeat for a total of 10 repetitions. Repeat 2 times. Complete this exercise 3 times per week.  Exercise N: Scapular retraction and elevation  1. Sit on a stable chair, or stand. 2. Secure an exercise band to a stable object in front of you so the band is at shoulder height. 3. Hold one end of the exercise band in each hand. Your palms should face each other. 4. Sit in a stable chair without armrests, or stand. 5. Step back, away from the secured end of the exercise band, until the band stretches. 6. Squeeze your shoulder blades together and lift your hands over your head. Keep your elbows straight. 7. Hold for 3 seconds. 8. Slowly return to the starting position. Repeat for a total of 10 repetitions. Repeat 2 times. Complete this exercise 3 times per week.  This information is not intended to replace advice given to you by your health care provider. Make sure you discuss any questions you have with your health care provider. Document Released: 04/04/2005 Document Revised: 12/10/2015 Document Reviewed: 02/19/2015 Elsevier Interactive Patient Education  2017 [employer].

## 2018-11-05 NOTE — Progress Notes (Signed)
Musculoskeletal Exam  Patient: Luke Vasquez DOB: [birth year]  DOS: 11/05/2018  SUBJECTIVE:  Chief Complaint:   Chief Complaint  Patient presents with  . Neck Pain    JULES BATY is a 53 y.o.  male for evaluation and treatment of neck pain.   Onset:  8 days ago. Slept on it wrong initially. Worked on car 3 d ago and got worse.  Location: R neck/upper back area Character:  aching and sharp  Progression of issue:  has worsened Associated symptoms: hurts whenever he moves Treatment: to date has been ice, oral steroids, heat, OTC NSAIDs, and muscle relaxers.   Neurovascular symptoms: no  ROS: Musculoskeletal/Extremities: +neck pain  Past Medical History:  Diagnosis Date  . Depression   . GERD (gastroesophageal reflux disease)   . History of heart attack 2013  . Hypertension   . Seasonal allergies     Objective: VITAL SIGNS: BP 118/84 (BP Location: Left Arm, Patient Position: Sitting, Cuff Size: Large)   Pulse (!) 135   Temp 98.8 F (37.1 C) (Oral)   Ht 5\' 10"  (1.778 m)   Wt 232 lb (105.2 kg)   SpO2 96%   BMI 33.29 kg/m  Constitutional: Well formed, well developed. No acute distress. Cardiovascular: Brisk cap refill Thorax & Lungs: No accessory muscle use Musculoskeletal: neck.   Normal active range of motion: Yes.   Normal passive range of motion: yes Tenderness to palpation: Yes, over trapezius on R Deformity: no Ecchymosis: no Tests positive: none Tests negative: Neer's, Spurling's, Cross over Neurologic: Normal sensory function. No focal deficits noted. DTR's equal and symmetric in UE's. No clonus. Psychiatric: Normal mood. Age appropriate judgment and insight. Alert & oriented x 3.    Assessment:  Neck pain - Plan: ketorolac (TORADOL) injection 60 mg, Mobic starting tomorrow, trial Zanaflex.  Heat, ice, Tylenol, stretches and exercises.  If no improvement, will trial physical therapy versus trigger point injections.  Plan: Orders as above. F/u  prn. The patient voiced understanding and agreement to the plan.   [place], DO 11/05/18  10:31 AM

## 2018-11-15 ENCOUNTER — Telehealth: Payer: Self-pay | Admitting: Family Medicine

## 2018-11-15 ENCOUNTER — Other Ambulatory Visit: Payer: Self-pay | Admitting: *Deleted

## 2018-11-15 DIAGNOSIS — I2510 Atherosclerotic heart disease of native coronary artery without angina pectoris: Secondary | ICD-10-CM

## 2018-11-15 MED ORDER — ROSUVASTATIN CALCIUM 20 MG PO TABS: 20.0000 mg | ORAL_TABLET | Freq: Every day | ORAL | 0 refills | Status: DC

## 2018-11-15 NOTE — Telephone Encounter (Signed)
ERROR

## 2018-11-15 NOTE — Telephone Encounter (Signed)
Appointment made for follow up cholesterol (need labs, general f/u).  rx sent in for 30 day supply only.

## 2018-11-15 NOTE — Telephone Encounter (Signed)
Medication: rosuvastatin (CRESTOR) 20 MG tablet   Patient is requesting a refill of this medication.    Pharmacy:  [place], [place] - [address] [place] [address] [phone] (Phone) [phone] (Fax)

## 2018-11-23 ENCOUNTER — Ambulatory Visit: Payer: 59 | Admitting: Family Medicine

## 2018-11-27 IMAGING — CR DG CHEST 2V
2 series · 2 of 2 positions shown · non-contrast
Comparison: November 20, 2017

CLINICAL DATA: Cough and shortness of breath.  Fever.

EXAM:
CHEST - 2 VIEW

[w chest pa]
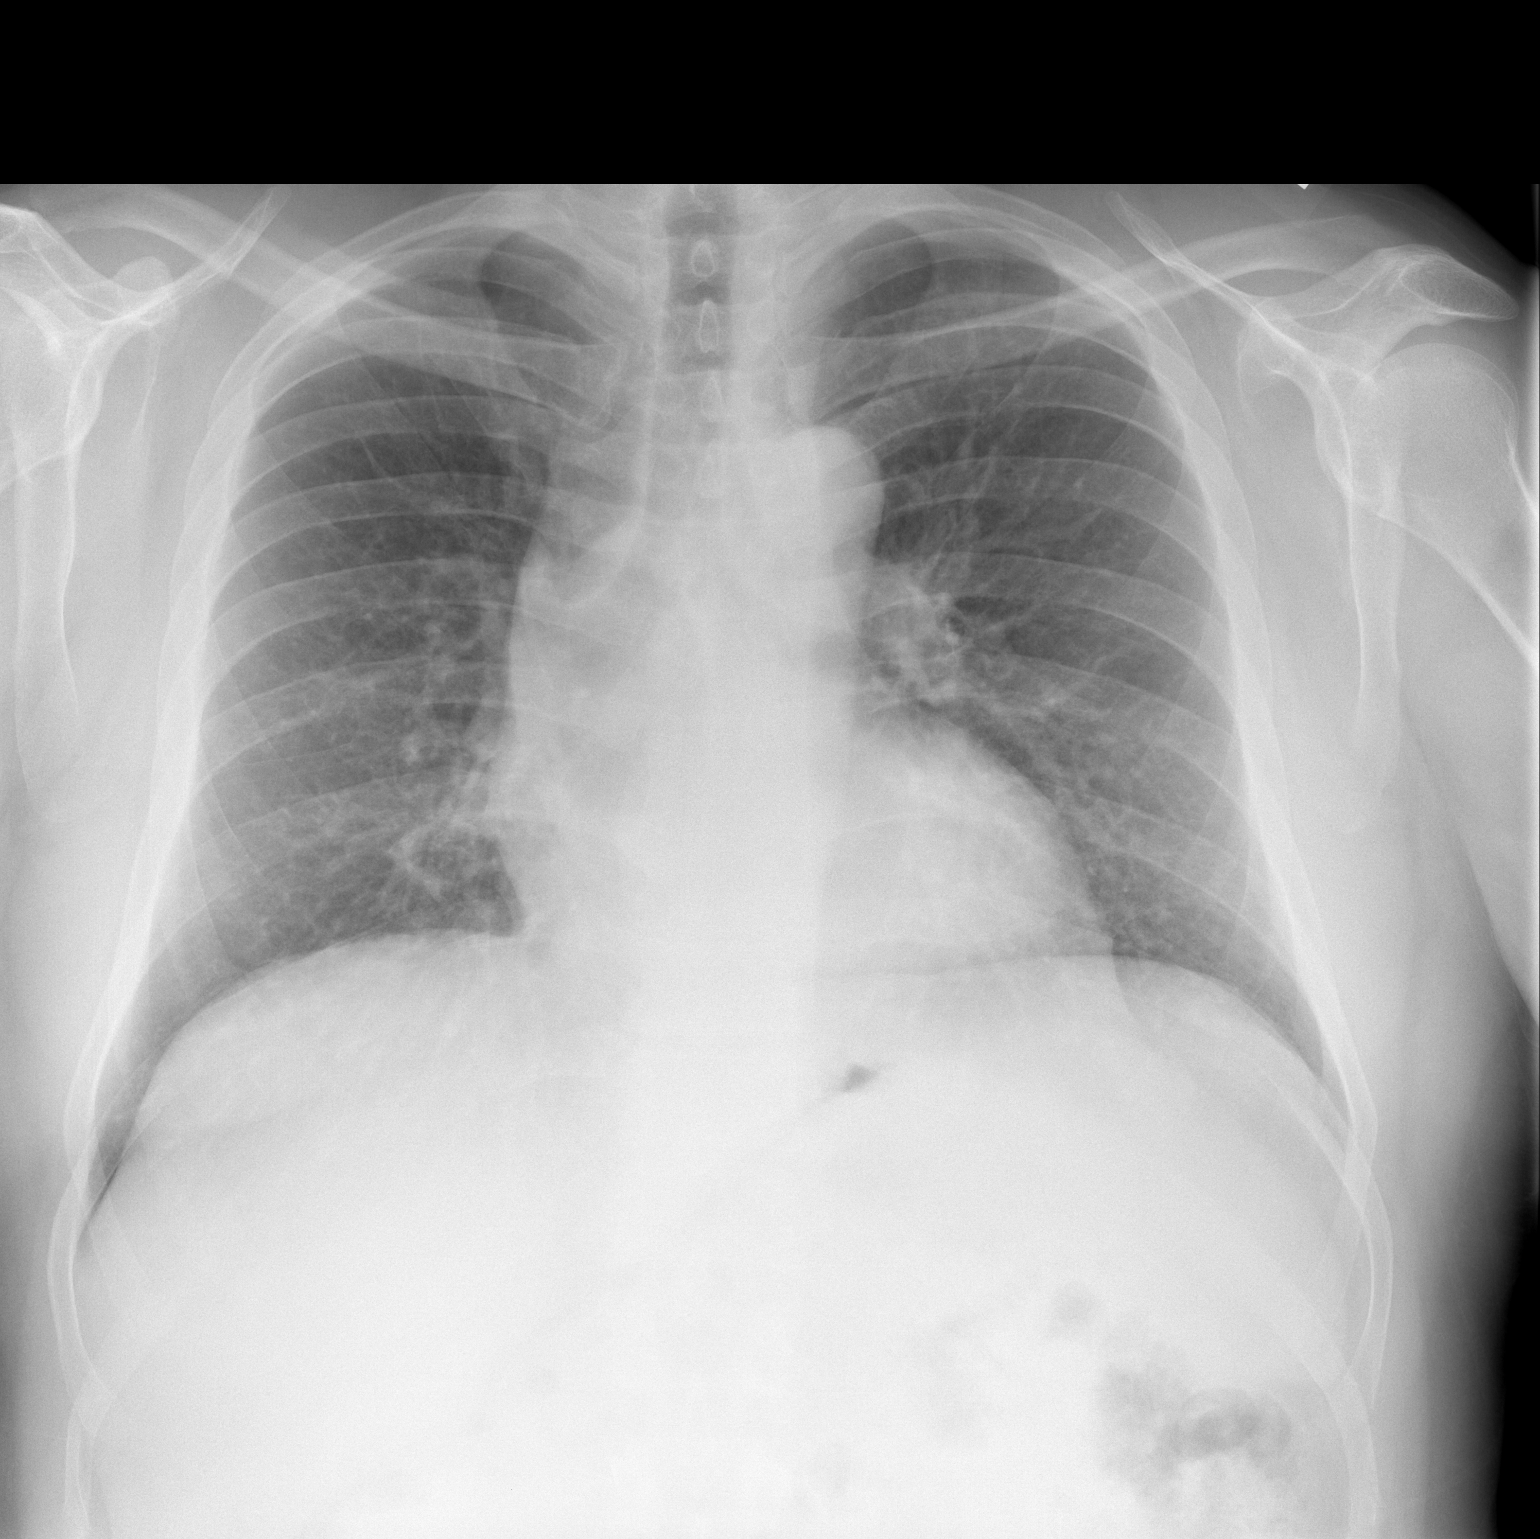

[w chest lat]
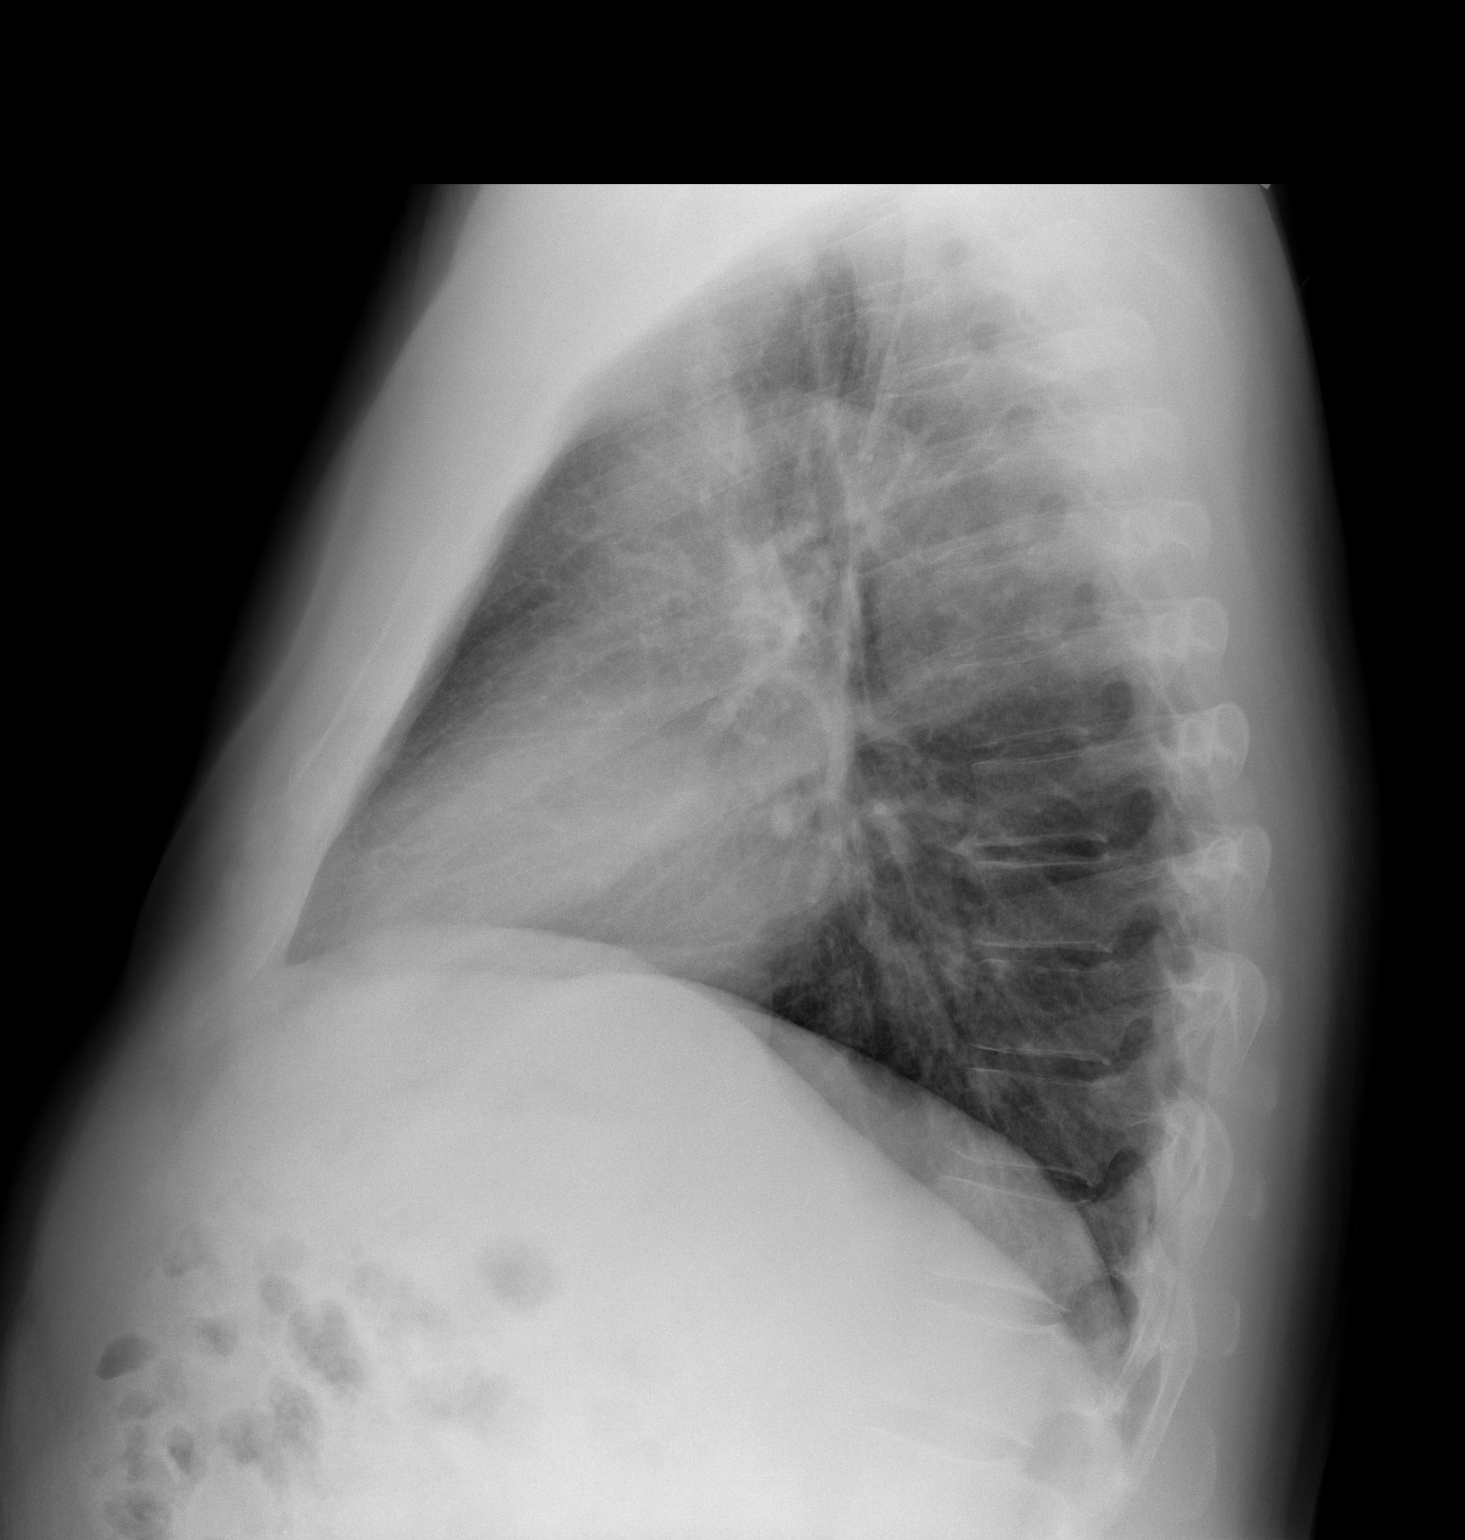

[2 of 2 positions shown; findings below may reference images not displayed]

FINDINGS: There is no evident edema or consolidation. Heart size and pulmonary
vascularity are normal. No adenopathy. No bone lesions.
IMPRESSION: No edema or consolidation.

## 2018-11-30 ENCOUNTER — Other Ambulatory Visit (INDEPENDENT_AMBULATORY_CARE_PROVIDER_SITE_OTHER): Payer: 59

## 2018-11-30 DIAGNOSIS — K219 Gastro-esophageal reflux disease without esophagitis: Secondary | ICD-10-CM

## 2018-11-30 DIAGNOSIS — R197 Diarrhea, unspecified: Secondary | ICD-10-CM

## 2018-11-30 DIAGNOSIS — R102 Pelvic and perineal pain: Secondary | ICD-10-CM

## 2018-11-30 DIAGNOSIS — R194 Change in bowel habit: Secondary | ICD-10-CM

## 2018-11-30 LAB — TSH: TSH: 1.17 u[IU]/mL (ref 0.35–4.50)

## 2018-11-30 LAB — IGA: IgA: 245 mg/dL (ref 68–378)

## 2018-11-30 LAB — HIGH SENSITIVITY CRP: CRP, High Sensitivity: 4.59 mg/L (ref 0.000–5.000)

## 2018-11-30 LAB — SEDIMENTATION RATE: Sed Rate: 10 mm/hr (ref 0–20)

## 2018-11-30 LAB — CORTISOL: Cortisol, Plasma: 7.5 ug/dL

## 2018-12-03 LAB — TISSUE TRANSGLUTAMINASE, IGA: (tTG) Ab, IgA: 1 U/mL

## 2018-12-06 ENCOUNTER — Other Ambulatory Visit: Payer: Self-pay | Admitting: Family Medicine

## 2018-12-06 DIAGNOSIS — I2510 Atherosclerotic heart disease of native coronary artery without angina pectoris: Secondary | ICD-10-CM

## 2018-12-11 ENCOUNTER — Encounter: Payer: Self-pay | Admitting: Gastroenterology

## 2018-12-13 ENCOUNTER — Telehealth: Payer: Self-pay

## 2018-12-13 NOTE — Telephone Encounter (Signed)
Covid-19 screening questions   Do you now or have you had a fever in the last 14 days?  Do you have any respiratory symptoms of shortness of breath or cough now or in the last 14 days?  Do you have any family members or close contacts with diagnosed or suspected Covid-19 in the past 14 days?  Have you been tested for Covid-19 and found to be positive?      L/m to c/b.

## 2018-12-13 NOTE — Telephone Encounter (Signed)
Pt responded "no" to all questions.  °

## 2018-12-14 ENCOUNTER — Encounter: Payer: Self-pay | Admitting: Gastroenterology

## 2018-12-14 ENCOUNTER — Ambulatory Visit (AMBULATORY_SURGERY_CENTER): Payer: 59 | Admitting: Gastroenterology

## 2018-12-14 ENCOUNTER — Other Ambulatory Visit: Payer: Self-pay

## 2018-12-14 VITALS — BP 124/80 | HR 64 | Temp 98.2°F | Resp 15 | Ht 70.0 in | Wt 232.0 lb

## 2018-12-14 DIAGNOSIS — D124 Benign neoplasm of descending colon: Secondary | ICD-10-CM

## 2018-12-14 DIAGNOSIS — K5730 Diverticulosis of large intestine without perforation or abscess without bleeding: Secondary | ICD-10-CM | POA: Diagnosis not present

## 2018-12-14 DIAGNOSIS — R197 Diarrhea, unspecified: Secondary | ICD-10-CM | POA: Diagnosis not present

## 2018-12-14 DIAGNOSIS — K317 Polyp of stomach and duodenum: Secondary | ICD-10-CM | POA: Diagnosis not present

## 2018-12-14 DIAGNOSIS — R102 Pelvic and perineal pain: Secondary | ICD-10-CM

## 2018-12-14 DIAGNOSIS — K635 Polyp of colon: Secondary | ICD-10-CM | POA: Diagnosis not present

## 2018-12-14 DIAGNOSIS — K3189 Other diseases of stomach and duodenum: Secondary | ICD-10-CM | POA: Diagnosis not present

## 2018-12-14 DIAGNOSIS — K648 Other hemorrhoids: Secondary | ICD-10-CM | POA: Diagnosis not present

## 2018-12-14 DIAGNOSIS — K2970 Gastritis, unspecified, without bleeding: Secondary | ICD-10-CM | POA: Diagnosis not present

## 2018-12-14 DIAGNOSIS — K219 Gastro-esophageal reflux disease without esophagitis: Secondary | ICD-10-CM

## 2018-12-14 MED ORDER — SODIUM CHLORIDE 0.9 % IV SOLN: 500.0000 mL | Freq: Once | INTRAVENOUS | Status: DC

## 2018-12-14 NOTE — Patient Instructions (Signed)
Handout on polyps, diverticulitis, and hemorrhoids given. Take Fibercon tablet daily   YOU HAD AN ENDOSCOPIC PROCEDURE TODAY AT [facility]:   Refer to the procedure report that was given to you for any specific questions about what was found during the examination.  If the procedure report does not answer your questions, please call your gastroenterologist to clarify.  If you requested that your care partner not be given the details of your procedure findings, then the procedure report has been included in a sealed envelope for you to review at your convenience later.  YOU SHOULD EXPECT: Some feelings of bloating in the abdomen. Passage of more gas than usual.  Walking can help get rid of the air that was put into your GI tract during the procedure and reduce the bloating. If you had a lower endoscopy (such as a colonoscopy or flexible sigmoidoscopy) you may notice spotting of blood in your stool or on the toilet paper. If you underwent a bowel prep for your procedure, you may not have a normal bowel movement for a few days.  Please Note:  You might notice some irritation and congestion in your nose or some drainage.  This is from the oxygen used during your procedure.  There is no need for concern and it should clear up in a day or so.  SYMPTOMS TO REPORT IMMEDIATELY:   Following lower endoscopy (colonoscopy or flexible sigmoidoscopy):  Excessive amounts of blood in the stool  Significant tenderness or worsening of abdominal pains  Swelling of the abdomen that is new, acute  Fever of 100F or higher   Following upper endoscopy (EGD)  Vomiting of blood or coffee ground material  New chest pain or pain under the shoulder blades  Painful or persistently difficult swallowing  New shortness of breath  Fever of 100F or higher  Black, tarry-looking stools  For urgent or emergent issues, a gastroenterologist can be reached at any hour by calling [phone].   DIET:  We  do recommend a small meal at first, but then you may proceed to your regular diet.  Drink plenty of fluids but you should avoid alcoholic beverages for 24 hours.  ACTIVITY:  You should plan to take it easy for the rest of today and you should NOT DRIVE or use heavy machinery until tomorrow (because of the sedation medicines used during the test).    FOLLOW UP: Our staff will call the number listed on your records 48-72 hours following your procedure to check on you and address any questions or concerns that you may have regarding the information given to you following your procedure. If we do not reach you, we will leave a message.  We will attempt to reach you two times.  During this call, we will ask if you have developed any symptoms of COVID 19. If you develop any symptoms (ie: fever, flu-like symptoms, shortness of breath, cough etc.) before then, please call [phone].  If you test positive for Covid 19 in the 2 weeks post procedure, please call and report this information to [place].    If any biopsies were taken you will be contacted by phone or by letter within the next 1-3 weeks.  Please call us at [phone] if you have not heard about the biopsies in 3 weeks.    SIGNATURES/CONFIDENTIALITY: You and/or your care partner have signed paperwork which will be entered into your electronic medical record.  These signatures attest to the fact that that  the information above on your After Visit Summary has been reviewed and is understood.  Full responsibility of the confidentiality of this discharge information lies with you and/or your care-partner.

## 2018-12-14 NOTE — Progress Notes (Signed)
Report to PACU, RN, vss, BBS= Clear.  

## 2018-12-14 NOTE — Op Note (Signed)
[facility] Patient Name: Luke Vasquez Procedure Date: 12/14/2018 1:02 PM MRN: [id number] Endoscopist: Justice Britain , MD Age: 53 Referring MD:  Date of Birth: 05-13-1965 Gender: Male Account #: [id number] Procedure:                Upper GI endoscopy Indications:              Lower abdominal pain, Gastro-esophageal reflux                            disease, Diarrhea Medicines:                Monitored Anesthesia Care Procedure:                Pre-Anesthesia Assessment:                           - Prior to the procedure, a History and Physical                            was performed, and patient medications and                            allergies were reviewed. The patient's tolerance of                            previous anesthesia was also reviewed. The risks                            and benefits of the procedure and the sedation                            options and risks were discussed with the patient.                            All questions were answered, and informed consent                            was obtained. Prior Anticoagulants: The patient has                            taken no previous anticoagulant or antiplatelet                            agents except for aspirin. ASA Grade Assessment: II                            - A patient with mild systemic disease. After                            reviewing the risks and benefits, the patient was                            deemed in satisfactory condition to undergo the  procedure.                           After obtaining informed consent, the endoscope was                            passed under direct vision. Throughout the                            procedure, the patient's blood pressure, pulse, and                            oxygen saturations were monitored continuously. The                            Endoscope was introduced through the mouth, and                             advanced to the second part of duodenum. The upper                            GI endoscopy was accomplished without difficulty.                            The patient tolerated the procedure. Scope In: Scope Out: Findings:                 No gross lesions were noted in the entire                            esophagus. Biopsies were taken with a cold forceps                            for histology to rule out EoE.                           The Z-line was regular and was found 37 cm from the                            incisors.                           Nodular mucosa with no stigmata of recent bleeding                            were found in the gastric antrum in different                            regions. One of the regions seemed to be a                            thickened gastric fold overall. Biopsies were taken                            with a  cold forceps for histology to rule out                            adenoma.                           Multiple small sessile polyps were found in the                            gastric fundus and in the gastric body - likely                            fundic gland. Biopsies were taken with a cold                            forceps for histology to rule out adenoma.                           No other gross lesions were noted in the entire                            examined stomach. Biopsies were taken with a cold                            forceps for histology and HP evaluation.                           No gross lesions were noted in the duodenal bulb,                            in the first portion of the duodenum and in the                            second portion of the duodenum. Biopsies for                            histology were taken with a cold forceps for                            evaluation of celiac disease and enteropathy rule                            out. Complications:            No immediate  complications. Estimated Blood Loss:     Estimated blood loss was minimal. Impression:               - No gross lesions in esophagus. Biopsied for EoE                            and LoE.                           - Z-line regular, 37 cm from the incisors.                           -  Nodularity of the antrum. Biopsied.                           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                            Biopsied.                           - No gross lesions in the stomach. Biopsied for HP.                           - No gross lesions in the duodenal bulb, in the                            first portion of the duodenum and in the second                            portion of the duodenum. Biopsied. Recommendation:           - Proceed to scheduled colonoscopy.                           - Continue present medications.                           - Await pathology results.                           - If evidence of adenomatous tissue will require                            repeat EGDs for removal.                           - The findings and recommendations were discussed                            with the patient. Justice Britain, MD 12/14/2018 1:56:39 PM

## 2018-12-14 NOTE — Progress Notes (Signed)
Pt's states no medical or surgical changes since previsit or office visit.  [place]

## 2018-12-14 NOTE — Progress Notes (Signed)
Called to room to assist during endoscopic procedure.  Patient ID and intended procedure confirmed with present staff. Received instructions for my participation in the procedure from the performing physician.  

## 2018-12-14 NOTE — Op Note (Signed)
[facility] Patient Name: Luke Vasquez Procedure Date: 12/14/2018 1:02 PM MRN: [id number] Endoscopist: Justice Britain , MD Age: 53 Referring MD:  Date of Birth: 1965-11-25 Gender: Male Account #: [id number] Procedure:                Colonoscopy Indications:              Screening for colorectal malignant neoplasm,                            Incidental - Clinically significant diarrhea of                            unexplained origin, Incidental - Lower abdominal                            pain Medicines:                Monitored Anesthesia Care Procedure:                Pre-Anesthesia Assessment:                           - Prior to the procedure, a History and Physical                            was performed, and patient medications and                            allergies were reviewed. The patient's tolerance of                            previous anesthesia was also reviewed. The risks                            and benefits of the procedure and the sedation                            options and risks were discussed with the patient.                            All questions were answered, and informed consent                            was obtained. Prior Anticoagulants: The patient has                            taken no previous anticoagulant or antiplatelet                            agents except for aspirin. ASA Grade Assessment: II                            - A patient with mild systemic disease. After  reviewing the risks and benefits, the patient was                            deemed in satisfactory condition to undergo the                            procedure.                           After obtaining informed consent, the colonoscope                            was passed under direct vision. Throughout the                            procedure, the patient's blood pressure, pulse, and                            oxygen  saturations were monitored continuously. The                            Colonoscope was introduced through the anus and                            advanced to the 6 cm into the ileum. The                            colonoscopy was performed without difficulty. The                            patient tolerated the procedure. The quality of the                            bowel preparation was good. The terminal ileum,                            ileocecal valve, appendiceal orifice, and rectum                            were photographed. Scope In: 1:26:32 PM Scope Out: 1:47:25 PM Scope Withdrawal Time: 0 hours 16 minutes 4 seconds  Total Procedure Duration: 0 hours 20 minutes 53 seconds  Findings:                 The digital rectal exam findings include                            hemorrhoids. Pertinent negatives include no                            palpable rectal lesions.                           A localized area of mild ileal enhanced mucosa with  granularity was found 4 cm into the terminal ileum.                            Biopsies were taken with a cold forceps for                            histology to rule out dysplasia.                           The rest of the visualized terminal ileum and                            ileocecal valve appeared normal. Biopsies were                            taken with a cold forceps for histology to rule out                            IBD.                           A few small-mouthed diverticula were found in the                            ascending colon.                           A 3 mm polyp was found in the descending colon. The                            polyp was sessile. The polyp was removed with a                            cold snare. Resection and retrieval were complete.                           Normal mucosa was found in the entire colon                            otherwise. Biopsies were taken with a cold  forceps                            for histology to rule out microscopic colitis/IBD.                           Non-bleeding non-thrombosed internal hemorrhoids                            were found during retroflexion, during perianal                            exam and during digital exam. The hemorrhoids were  Grade II (internal hemorrhoids that prolapse but                            reduce spontaneously). Complications:            No immediate complications. Estimated Blood Loss:     Estimated blood loss was minimal. Impression:               - Hemorrhoids found on digital rectal exam.                           - Granular ileal mucosa. Biopsied to rule out                            dysplasia.                           - The rest of examined portion of the ileum was                            normal. Biopsied.                           - Diverticulosis in the ascending colon.                           - One 3 mm polyp in the descending colon, removed                            with a cold snare. Resected and retrieved.                           - Normal mucosa in the entire examined colon                            otherwise. Biopsied.                           - Non-bleeding non-thrombosed internal hemorrhoids. Recommendation:           - The patient will be observed post-procedure,                            until all discharge criteria are met.                           - Discharge patient to home.                           - Patient has a contact number available for                            emergencies. The signs and symptoms of potential                            delayed complications were discussed with the  patient. Return to normal activities tomorrow.                            Written discharge instructions were provided to the                            patient.                           - High fiber diet.                            - Use FiberCon 1 tablet PO daily.                           - Continue present medications.                           - Await pathology results.                           - Repeat colonoscopy 7/10 years for surveillance                            based on pathology results.                           - The findings and recommendations were discussed                            with the patient. Justice Britain, MD 12/14/2018 2:02:28 PM

## 2018-12-17 ENCOUNTER — Other Ambulatory Visit: Payer: Self-pay

## 2018-12-18 ENCOUNTER — Encounter: Payer: Self-pay | Admitting: Family Medicine

## 2018-12-18 ENCOUNTER — Ambulatory Visit (INDEPENDENT_AMBULATORY_CARE_PROVIDER_SITE_OTHER): Payer: 59 | Admitting: Family Medicine

## 2018-12-18 ENCOUNTER — Telehealth: Payer: Self-pay | Admitting: *Deleted

## 2018-12-18 ENCOUNTER — Telehealth: Payer: Self-pay

## 2018-12-18 DIAGNOSIS — R197 Diarrhea, unspecified: Secondary | ICD-10-CM

## 2018-12-18 DIAGNOSIS — R110 Nausea: Secondary | ICD-10-CM

## 2018-12-18 DIAGNOSIS — R1030 Lower abdominal pain, unspecified: Secondary | ICD-10-CM | POA: Diagnosis not present

## 2018-12-18 MED ORDER — DICYCLOMINE HCL 10 MG PO CAPS: ORAL_CAPSULE | ORAL | 0 refills | Status: DC

## 2018-12-18 MED ORDER — ONDANSETRON 4 MG PO TBDP: 4.0000 mg | ORAL_TABLET | Freq: Three times a day (TID) | ORAL | 0 refills | Status: DC | PRN

## 2018-12-18 NOTE — Telephone Encounter (Signed)
No answer for post procedure follow up call left message and will call back later this afternoon. SM

## 2018-12-18 NOTE — Progress Notes (Signed)
Chief Complaint  Patient presents with  . Diarrhea     Subjective Luke Vasquez is a 53 y.o. male who presents with nausea and diarrhea. Due to COVID-19 pandemic, we are interacting via web portal for an electronic face-to-face visit. I verified patient's ID using 2 identifiers. Patient agreed to proceed with visit via this method. Patient is at home, I am at office. Patient and I are present for visit.  Symptoms began around 1 week ago, this is a recurring issue.  Pt has biopsies from scope pending with GI team.  Patient has abdominal pain, diarrhea and nausea Patient denies fever, arthralgias, myalgias and URI symptoms Therapies: probiotic Sick contacts: No  Past Medical History:  Diagnosis Date  . Depression   . GERD (gastroesophageal reflux disease)   . History of heart attack 2013  . Hypertension   . Seasonal allergies    Past Surgical History:  Procedure Laterality Date  . CORONARY STENT PLACEMENT  2013  . LITHOTRIPSY     No Known Allergies  Review of Systems Gastrointestinal:  As noted in the HPI  Exam No conversational dyspnea Age appropriate judgment and insight Nml affect and mood  Assessment and Plan  Diarrhea, unspecified type  Nausea - Plan: ondansetron (ZOFRAN-ODT) 4 MG disintegrating tablet  Lower abdominal pain - Plan: dicyclomine (BENTYL) 10 MG capsule  Symptomatic care while awaiting results of biopsies. Consider Metamucil to add bulk to watery stools. Stop if it makes things worse. If biopsies neg, will consider IBS-D. F/u prn.  The patient voiced understanding and agreement to the plan.  [place], DO 12/18/18  7:50 AM

## 2018-12-18 NOTE — Telephone Encounter (Signed)
No answer, left message to call if having any issues or concerns, B.Adonis Ryther RN 

## 2018-12-22 ENCOUNTER — Encounter: Payer: Self-pay | Admitting: Gastroenterology

## 2018-12-26 ENCOUNTER — Telehealth: Payer: Self-pay | Admitting: Gastroenterology

## 2018-12-26 NOTE — Telephone Encounter (Signed)
No answer message left that a letter was mailed out on 9/5 with path results.  He was advised to return call if he has any further questions.

## 2018-12-26 NOTE — Telephone Encounter (Signed)
Pt calling regarding path results. °

## 2019-01-13 ENCOUNTER — Other Ambulatory Visit: Payer: Self-pay | Admitting: Family Medicine

## 2019-01-13 DIAGNOSIS — I2510 Atherosclerotic heart disease of native coronary artery without angina pectoris: Secondary | ICD-10-CM

## 2019-02-10 ENCOUNTER — Other Ambulatory Visit: Payer: Self-pay | Admitting: Family Medicine

## 2019-02-10 DIAGNOSIS — I2510 Atherosclerotic heart disease of native coronary artery without angina pectoris: Secondary | ICD-10-CM

## 2019-02-26 ENCOUNTER — Telehealth: Payer: Self-pay | Admitting: Family Medicine

## 2019-02-26 ENCOUNTER — Ambulatory Visit (INDEPENDENT_AMBULATORY_CARE_PROVIDER_SITE_OTHER): Payer: 59 | Admitting: Family Medicine

## 2019-02-26 ENCOUNTER — Other Ambulatory Visit: Payer: Self-pay

## 2019-02-26 ENCOUNTER — Encounter: Payer: Self-pay | Admitting: Family Medicine

## 2019-02-26 DIAGNOSIS — J0110 Acute frontal sinusitis, unspecified: Secondary | ICD-10-CM

## 2019-02-26 MED ORDER — PREDNISONE 20 MG PO TABS: 40.0000 mg | ORAL_TABLET | Freq: Every day | ORAL | 0 refills | Status: AC

## 2019-02-26 NOTE — Progress Notes (Signed)
Chief Complaint  Patient presents with  . Cough    all symptoms started 02/24/2019  . Sore Throat  . Headache    Luke Vasquez here for URI complaints. Due to COVID-19 pandemic, we are interacting via web portal for an electronic face-to-face visit. I verified patient's ID using 2 identifiers. Patient agreed to proceed with visit via this method. Patient is at home, I am at office. Patient and I are present for visit.   Duration: 2 days  Associated symptoms: sinus congestion, sinus pain, rhinorrhea and itchy watery eyes Denies: ear pain, ear drainage, sore throat, wheezing, shortness of breath, myalgia and N/V, cough, fevers Treatment to date: Augmentin gave him diarrhea Sick contacts: No  ROS:  Const: Denies fevers HEENT: As noted in HPI Lungs: No SOB  Past Medical History:  Diagnosis Date  . Depression   . GERD (gastroesophageal reflux disease)   . History of heart attack 2013  . Hypertension   . Seasonal allergies    Exam No conversational dyspnea Age appropriate judgment and insight Nml affect and mood  Acute frontal sinusitis, recurrence not specified - Plan: predniSONE (DELTASONE) 20 MG tablet  Likely allergic. Stop Augmentin, start pred burst. Cont INCS, Zyrtec, and Singulair.  Continue to push fluids, practice good hand hygiene, cover mouth when coughing. F/u prn. If starting to experience fevers, shaking, or shortness of breath, seek immediate care. Pt voiced understanding and agreement to the plan.  [place], DO 02/26/19 10:06 AM

## 2019-02-26 NOTE — Telephone Encounter (Signed)
Copied from [place] [phone]. 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 ,   Resent and pt aware

## 2019-02-27 ENCOUNTER — Telehealth: Payer: Self-pay

## 2019-02-27 ENCOUNTER — Encounter: Payer: Self-pay | Admitting: Family Medicine

## 2019-02-27 NOTE — Telephone Encounter (Signed)
Copied from [place] [id number]. Topic: General - Other >> Feb 27, 2019  8:53 AM Leward Quan A wrote: Reason for CRM: Patient called to ask Dr Nani Ravens if he could please extend his work note over to today since he took the medication and it is working so well it is causing him to cough to the point where he is vomiting. Patient asking for the note to be uploaded to his My Chart please so he will not be penalized for not going to work. Any questions please call Ph# [phone]

## 2019-02-27 NOTE — Telephone Encounter (Signed)
That's fine. Ty.  

## 2019-02-27 NOTE — Telephone Encounter (Signed)
Done patient notified.

## 2019-03-18 ENCOUNTER — Encounter: Payer: Self-pay | Admitting: Family Medicine

## 2019-03-18 ENCOUNTER — Other Ambulatory Visit: Payer: Self-pay

## 2019-03-18 ENCOUNTER — Telehealth: Payer: Self-pay

## 2019-03-18 ENCOUNTER — Ambulatory Visit (INDEPENDENT_AMBULATORY_CARE_PROVIDER_SITE_OTHER): Payer: 59 | Admitting: Family Medicine

## 2019-03-18 DIAGNOSIS — J329 Chronic sinusitis, unspecified: Secondary | ICD-10-CM

## 2019-03-18 MED ORDER — METHYLPREDNISOLONE 4 MG PO TBPK: ORAL_TABLET | ORAL | 0 refills | Status: DC

## 2019-03-18 MED ORDER — DOXYCYCLINE HYCLATE 100 MG PO TABS: 100.0000 mg | ORAL_TABLET | Freq: Two times a day (BID) | ORAL | 0 refills | Status: DC

## 2019-03-18 NOTE — Telephone Encounter (Signed)
Copied from [place] [id number]. Topic: General - Other >> Mar 18, 2019  8:31 AM Ivar Drape wrote: Reason for CRM:   Patient would like a virtual appt today with Dr. Nani Ravens for his congestion.  He said he has no other symptoms.

## 2019-03-18 NOTE — Telephone Encounter (Signed)
Scheduled today at 2;15

## 2019-03-18 NOTE — Progress Notes (Signed)
Chief Complaint  Patient presents with  . Nasal Congestion    Luke Vasquez here for URI complaints. Due to COVID-19 pandemic, we are interacting via web portal for an electronic face-to-face visit. I verified patient's ID using 2 identifiers. Patient agreed to proceed with visit via this method. Patient is at home, I am at office. Patient and I are present for visit.   Duration: 2 days Associated symptoms: sinus congestion, sinus pain, rhinorrhea and cough Denies: itchy watery eyes, ear pain, ear drainage, sore throat, wheezing, shortness of breath, myalgia, dental pain, N/V and fevers Treatment to date: Flonase, Clorcetin  Sick contacts: No +hx of recurrent sinusitis. Was told by ENT in [place] that he needs sinus surg which he has put off. Would like to get, needs referral.   ROS:  Const: Denies fevers HEENT: As noted in HPI Lungs: No SOB  Past Medical History:  Diagnosis Date  . Depression   . GERD (gastroesophageal reflux disease)   . History of heart attack 2013  . Hypertension   . Seasonal allergies    Exam No conversational dyspnea Age appropriate judgment and insight Nml affect and mood  Recurrent sinusitis - Plan: Ambulatory referral to ENT, doxycycline (VIBRA-TABS) 100 MG tablet, methylPREDNISolone (MEDROL DOSEPAK) 4 MG TBPK tablet  Medrol Dosepak. If he does OK, hold off on doxy. Refer ENT for consultation regarding possible surg.  Letter for work given.  F/u prn. If starting to experience fevers, shaking, or shortness of breath, seek immediate care. Pt voiced understanding and agreement to the plan.  [place], DO 03/18/19 2:26 PM

## 2019-03-19 ENCOUNTER — Encounter: Payer: Self-pay | Admitting: Family Medicine

## 2019-03-24 ENCOUNTER — Other Ambulatory Visit: Payer: Self-pay | Admitting: Family Medicine

## 2019-03-27 ENCOUNTER — Encounter: Payer: Self-pay | Admitting: Family Medicine

## 2019-03-27 ENCOUNTER — Ambulatory Visit (INDEPENDENT_AMBULATORY_CARE_PROVIDER_SITE_OTHER): Payer: 59 | Admitting: Family Medicine

## 2019-03-27 VITALS — Temp 97.3°F

## 2019-03-27 DIAGNOSIS — M546 Pain in thoracic spine: Secondary | ICD-10-CM | POA: Diagnosis not present

## 2019-03-27 MED ORDER — METHOCARBAMOL 750 MG PO TABS: 750.0000 mg | ORAL_TABLET | Freq: Three times a day (TID) | ORAL | 0 refills | Status: DC | PRN

## 2019-03-27 MED ORDER — METHYLPREDNISOLONE 4 MG PO TBPK: ORAL_TABLET | ORAL | 0 refills | Status: DC

## 2019-03-27 MED ORDER — HYDROCODONE-ACETAMINOPHEN 5-325 MG PO TABS: 1.0000 | ORAL_TABLET | Freq: Four times a day (QID) | ORAL | 0 refills | Status: DC | PRN

## 2019-03-27 NOTE — Progress Notes (Signed)
Musculoskeletal Exam  Patient: Luke Vasquez DOB: [birth year]  DOS: 03/27/2019  SUBJECTIVE:  Chief Complaint:   Chief Complaint  Patient presents with  . Back Pain    3 days    [name] is a 53 y.o.  male for evaluation and treatment of upper neck pain. Due to COVID-19 pandemic, we are interacting via web portal for an electronic face-to-face visit. I verified patient's ID using 2 identifiers. Patient agreed to proceed with visit via this method. Patient is at home, I am at office. Patient and I are present for visit.   Onset:  3 days ago. No inj or change in activity.  Location: middle of upper back Character:  sharp  Progression of issue:  is unchanged Associated symptoms: none;  Treatment: to date has been muscle relaxers and heat.   Neurovascular symptoms: no  ROS: Musculoskeletal/Extremities: +back/neck pain  Past Medical History:  Diagnosis Date  . Depression   . GERD (gastroesophageal reflux disease)   . History of heart attack 2013  . Hypertension   . Seasonal allergies     Objective: VITAL SIGNS: Temp (!) 97.3 F (36.3 C) (Oral)  No conversational dyspnea Age appropriate judgment and insight Nml affect and mood  Assessment:  Acute bilateral thoracic back pain - Plan: methylPREDNISolone (MEDROL DOSEPAK) 4 MG TBPK tablet, HYDROcodone-acetaminophen (NORCO/VICODIN) 5-325 MG tablet, methocarbamol (ROBAXIN) 750 MG tablet  Plan: Steroid for inflammation, Robaxin for muscle relief, hydrocodone for breakthrough pain.  Heat, ice, Tylenol, stretches/exercises.  A letter for work excusing for today provided. Warnings about hydrocodone and muscle relaxants provided. F/u prn. The patient voiced understanding and agreement to the plan.   [place], DO 03/27/19  10:38 AM

## 2019-04-03 ENCOUNTER — Ambulatory Visit (INDEPENDENT_AMBULATORY_CARE_PROVIDER_SITE_OTHER): Payer: 59 | Admitting: Family Medicine

## 2019-04-03 ENCOUNTER — Encounter: Payer: Self-pay | Admitting: Family Medicine

## 2019-04-03 ENCOUNTER — Telehealth: Payer: Self-pay | Admitting: *Deleted

## 2019-04-03 ENCOUNTER — Other Ambulatory Visit: Payer: Self-pay

## 2019-04-03 DIAGNOSIS — N200 Calculus of kidney: Secondary | ICD-10-CM

## 2019-04-03 MED ORDER — ONDANSETRON 4 MG PO TBDP: 4.0000 mg | ORAL_TABLET | Freq: Three times a day (TID) | ORAL | 0 refills | Status: DC | PRN

## 2019-04-03 MED ORDER — OXYCODONE HCL 5 MG PO CAPS: 5.0000 mg | ORAL_CAPSULE | Freq: Four times a day (QID) | ORAL | 0 refills | Status: DC | PRN

## 2019-04-03 MED ORDER — TAMSULOSIN HCL 0.4 MG PO CAPS: 0.4000 mg | ORAL_CAPSULE | Freq: Every day | ORAL | 0 refills | Status: DC

## 2019-04-03 NOTE — Telephone Encounter (Signed)
Appointment already scheduled.  Copied from [place] [id number]. Topic: General - Other >> Apr 03, 2019  8:05 AM Oneta Rack wrote: Patient requesting an acute virtual appointment today with PCP for back pain.

## 2019-04-03 NOTE — Progress Notes (Signed)
Musculoskeletal Exam  Patient: Luke Vasquez DOB: 03/07/1966  DOS: 04/03/2019  SUBJECTIVE:  Chief Complaint:   Chief Complaint  Patient presents with  . Back Pain    Luke Vasquez is a 53 y.o.  male for evaluation and treatment of his back pain.   Onset:  4 days ago.  No inj or change in activity.  Location: lower middle back radiating to abd Character:  aching, sharp and shooting  Progression of issue:  is unchanged Associated symptoms: nausea Denies bowel/bladder incontinence or weakness Treatment: to date has been Tylenol, narcotics In ED, CT scan showed a stone on L. +hx of stones. .   Neurovascular symptoms: no  ROS: Musculoskeletal/Extremities: +back pain Neurologic: no numbness, tingling no weakness   Past Medical History:  Diagnosis Date  . Depression   . GERD (gastroesophageal reflux disease)   . History of heart attack 2013  . Hypertension   . Seasonal allergies     Objective: No conversational dyspnea Age appropriate judgment and insight Nml affect and mood  Assessment:  Nephrolithiasis - Plan: ondansetron (ZOFRAN-ODT) 4 MG disintegrating tablet, tamsulosin (FLOMAX) 0.4 MG CAPS capsule, oxycodone (OXY-IR) 5 MG capsule  Plan: Orders as above. Stay hydrated. Tylenol. Letter for work given.  F/u prn. The patient voiced understanding and agreement to the plan.   [place], DO 04/03/19  1:50 PM

## 2019-04-22 ENCOUNTER — Ambulatory Visit (INDEPENDENT_AMBULATORY_CARE_PROVIDER_SITE_OTHER): Payer: 59 | Admitting: Family Medicine

## 2019-04-22 ENCOUNTER — Other Ambulatory Visit: Payer: Self-pay

## 2019-04-22 ENCOUNTER — Encounter: Payer: Self-pay | Admitting: Family Medicine

## 2019-04-22 DIAGNOSIS — H00014 Hordeolum externum left upper eyelid: Secondary | ICD-10-CM

## 2019-04-22 MED ORDER — ERYTHROMYCIN 5 MG/GM OP OINT: 1.0000 "application " | TOPICAL_OINTMENT | Freq: Every day | OPHTHALMIC | 0 refills | Status: AC

## 2019-04-22 NOTE — Progress Notes (Signed)
No chief complaint on file.   Luke Vasquez is here for left eye irritation. Due to COVID-19 pandemic, we are interacting via web portal for an electronic face-to-face visit. I verified patient's ID using 2 identifiers. Patient agreed to proceed with visit via this method. Patient is at home, I am at office. Patient and I are present for visit.   Duration: 3 days Chemical exposure? No  Recent URI? No  Contact lenses? No  History of allergies? No  Treatment to date: None Feels something pushing against eye. No fevers.  ROS:  Eyes: As noted above  Past Medical History:  Diagnosis Date  . Depression   . GERD (gastroesophageal reflux disease)   . History of heart attack 2013  . Hypertension   . Seasonal allergies    Family History  Problem Relation Age of Onset  . Hypertension Mother   . Gallbladder disease Mother   . Hypertension Father   . High blood pressure Brother   . Colon cancer Neg Hx   . Esophageal cancer Neg Hx   . Inflammatory bowel disease Neg Hx   . Liver disease Neg Hx   . Pancreatic cancer Neg Hx   . Rectal cancer Neg Hx   . Stomach cancer Neg Hx    Exam No conversational dyspnea Age appropriate judgment and insight Nml affect and mood   Hordeolum of left upper eyelid, unspecified hordeolum type - Plan: erythromycin ophthalmic ointment  Orders as above for lubrication. Did not get a good view over web chat unfortunately.  Warm compresses and artificial tears also recommended. F/u if no improvement in 7-10 days. Pt voiced understanding and agreement to the plan.  [place], DO 04/22/19 9:09 AM

## 2019-05-29 ENCOUNTER — Encounter: Payer: Self-pay | Admitting: Medical

## 2019-05-29 ENCOUNTER — Ambulatory Visit (INDEPENDENT_AMBULATORY_CARE_PROVIDER_SITE_OTHER): Payer: 59 | Admitting: Medical

## 2019-05-29 ENCOUNTER — Other Ambulatory Visit: Payer: Self-pay

## 2019-05-29 DIAGNOSIS — J189 Pneumonia, unspecified organism: Secondary | ICD-10-CM

## 2019-05-29 DIAGNOSIS — M791 Myalgia, unspecified site: Secondary | ICD-10-CM | POA: Diagnosis not present

## 2019-05-29 DIAGNOSIS — R5383 Other fatigue: Secondary | ICD-10-CM

## 2019-05-29 NOTE — Patient Instructions (Addendum)
Pt had covid end of January more than 2 weeks ago. Went back to work and felt ill. Worked up at [facility] and found to have pneumonia and 02 sat close to 90%. No improvement with prednisone. He also started zpack. He feels worse compared to yesterday.  Pt state no lab but did have ekg done at [facility]. With his history of covid I think he needs stat d dimer and probable ct angiogram as well. He lives in [place]. I recommended he be seen by ED at [facility]. He needs stat work up, possible iv steroid and iv antibiotic.  Follow up date to be determined after ED evaluation.

## 2019-05-29 NOTE — Progress Notes (Signed)
Subjective:    Patient ID: HAOCHEN HECHT, male    DOB: 06-Feb-1966, 54 y.o.   MRN: [id number]  HPI Virtual Visit via Telephone Note  I connected with NGOC MALLA on 05/29/19 at  2:20 PM EST by telephone and verified that I am speaking with the correct person using two identifiers.  Location: Patient: home Provider: office.   I discussed the limitations, risks, security and privacy concerns of performing an evaluation and management service by telephone and the availability of in person appointments. I also discussed with the patient that there may be a patient responsible charge related to this service. The patient expressed understanding and agreed to proceed.   History of Present Illness:  Pt in states he was diagnosed with covid 21st of January.   He state this past Sunday he went back to work. He felt weak and nausea. Then Tuesday he felt almost so dizzy he felt like was about to pass out. [facility] went [facility] and was diagnosed with [facility]. Pt had ekg done. Pt did not do any blood work.  Pt o2 sat are between 90-92%. States sob on ambulation.  At [place] med told one side pneumonia.   Pt does not have history of asthma. He had hx of heart attack in 2013. No hx of smoking.  Pt was given steroid 40 mg for 4 days. Z pack.  Pt has some leg pain/calf pain.     Observations/Objective: General- no acute distress, pleasant, alert and oriented. But stating feels bad.  Assessment and Plan:  Pt had covid end of January more than 2 weeks ago. Went back to work and felt ill. Worked up at [facility] and found to have pneumonia and 02 sat close to 90%. No improvement with prednisone. He also started zpack. He feels worse compared to yesterday.  Pt state no lab but did have ekg done at [facility]. With his history of covid I think he needs stat d dimer and probable ct angiogram as well. He lives in [place]. I recommended he be seen by ED at [facility]. He needs stat work up,  possible iv steroid and iv antibiotic.  Follow up date to be determined after ED evaluation.  Mackie Pai, PA-C    Follow Up Instructions:    I discussed the assessment and treatment plan with the patient. The patient was provided an opportunity to ask questions and all were answered. The patient agreed with the plan and demonstrated an understanding of the instructions.   The patient was advised to call back or seek an in-person evaluation if the symptoms worsen or if the condition fails to improve as anticipated.  I provided 30 minutes of non-face-to-face time during this encounter.   Mackie Pai, PA-C    Review of Systems  Constitutional: Positive for fatigue. Negative for chills and fever.  HENT: Negative for congestion.   Respiratory: Positive for shortness of breath. Negative for chest tightness and wheezing.   Cardiovascular: Negative for chest pain and palpitations.  Gastrointestinal: Negative for abdominal pain.  Genitourinary: Negative for dysuria.  Musculoskeletal: Positive for myalgias. Negative for back pain.  Skin: Negative for rash.  Neurological: Positive for dizziness and light-headedness.       Objective:   Physical Exam        Assessment & Plan:  Pt had covid end of January more than 2 weeks ago. Went back to work and felt ill. Worked up at [facility] and found to have pneumonia and  02 sat close to 90%. No improvement with prednisone. He also started zpack. He feels worse compared to yesterday.  Pt state no lab but did have ekg done at [facility]. With his history of covid I think he needs stat d dimer and probable ct angiogram as well. He lives in [place]. I recommended he be seen by ED at [facility]. He needs stat work up, possible iv steroid and iv antibiotic.  Follow up date to be determined after ED evaluation.

## 2019-06-04 ENCOUNTER — Encounter: Payer: Self-pay | Admitting: Family Medicine

## 2019-06-04 ENCOUNTER — Ambulatory Visit (INDEPENDENT_AMBULATORY_CARE_PROVIDER_SITE_OTHER): Payer: 59 | Admitting: Family Medicine

## 2019-06-04 ENCOUNTER — Other Ambulatory Visit: Payer: Self-pay

## 2019-06-04 DIAGNOSIS — U071 COVID-19: Secondary | ICD-10-CM

## 2019-06-05 ENCOUNTER — Encounter: Payer: Self-pay | Admitting: Family Medicine

## 2019-06-05 ENCOUNTER — Telehealth: Payer: Self-pay | Admitting: Family Medicine

## 2019-06-05 NOTE — Telephone Encounter (Signed)
Luke Vasquez Short Term Disability Claim form faxed///received fax confirmation. Copied/sent to scan

## 2019-06-05 NOTE — Progress Notes (Signed)
Chief Complaint  Patient presents with  . Pneumonia    Recent Covid and Pneumonia    Subjective: Patient is a 54 y.o. male here for covid. Due to COVID-19 pandemic, we are interacting via web portal for an electronic face-to-face visit. I verified patient's ID using 2 identifiers. Patient agreed to proceed with visit via this method. Patient is at home, I am at office. Patient and I are present for visit.    Pt tested positive for covid several weeks ago. Still having a cough. Finishing up with cephalosporin for PNA. 2 d left. Breathing better. Needs to know when he can return to work. Needs letter and form filled out for STD.   ROS: Heart: Denies chest pain  Lungs: Denies SOB   Past Medical History:  Diagnosis Date  . Depression   . GERD (gastroesophageal reflux disease)   . History of heart attack 2013  . Hypertension   . Seasonal allergies     Objective: No conversational dyspnea Age appropriate judgment and insight Nml affect and mood  Assessment and Plan: COVID-19  Cont care. I think he just needs a few more days to turn the corner. Return Sunday, 06/09/19. STD form filled out and faxed.  The patient voiced understanding and agreement to the plan.  [place], DO 06/05/19  4:50 PM

## 2019-06-13 ENCOUNTER — Telehealth: Payer: Self-pay

## 2019-06-13 NOTE — Telephone Encounter (Signed)
Patient called in to check on the status of his Short Term Disability  Papers. The patient wants some one to follow up with him as soon as possible about this at [phone]   Thanks,

## 2019-06-14 NOTE — Telephone Encounter (Signed)
Left message on machine that paperwork was faxed on 2/17, confirmation was was received.

## 2019-06-28 ENCOUNTER — Other Ambulatory Visit: Payer: Self-pay | Admitting: Family Medicine

## 2019-07-01 ENCOUNTER — Encounter: Payer: Self-pay | Admitting: Family Medicine

## 2019-07-01 ENCOUNTER — Ambulatory Visit (INDEPENDENT_AMBULATORY_CARE_PROVIDER_SITE_OTHER): Payer: 59 | Admitting: Family Medicine

## 2019-07-01 ENCOUNTER — Other Ambulatory Visit: Payer: Self-pay

## 2019-07-01 DIAGNOSIS — K529 Noninfective gastroenteritis and colitis, unspecified: Secondary | ICD-10-CM

## 2019-07-01 MED ORDER — DICYCLOMINE HCL 20 MG PO TABS: 20.0000 mg | ORAL_TABLET | Freq: Three times a day (TID) | ORAL | 0 refills | Status: DC

## 2019-07-01 NOTE — Progress Notes (Signed)
Chief Complaint  Patient presents with  . Abdominal Pain    Subjective: Patient is a 54 y.o. male here for abd pain. Due to COVID-19 pandemic, we are interacting via web portal for an electronic face-to-face visit. I verified patient's ID using 2 identifiers. Patient agreed to proceed with visit via this method. Patient is at home, I am at office. Patient and I are present for visit.   Lower abd pain starting yesterday. Some L flank pain and groin pain. No bulges in that area. Denies urinary issues. BM's normal overall, slightly looser than usual. Pain described as sharp, flatus/BM's makes it better, eating makes it worse. No fevers, testicular pain, N/V/C, bleeding, recent travel, sick contacts, has not tried any meds.   ROS: Const: no fevers GI: as noted in HPI  Past Medical History:  Diagnosis Date  . Depression   . GERD (gastroesophageal reflux disease)   . History of heart attack 2013  . Hypertension   . Seasonal allergies     Objective: No conversational dyspnea Age appropriate judgment and insight Nml affect and mood  Assessment and Plan: Gastroenteritis - Plan: dicyclomine (BENTYL) 20 MG tablet  Letter for work given via Pharmacist, community- return Wed or sooner if he is feeling better. Bentyl prn. Push fluids. Consider Metamucil to add bulk to stools.  F/u prn. The patient voiced understanding and agreement to the plan.  [place], DO 07/01/19  3:30 PM

## 2019-07-15 ENCOUNTER — Encounter: Payer: Self-pay | Admitting: Family Medicine

## 2019-07-15 ENCOUNTER — Other Ambulatory Visit: Payer: Self-pay

## 2019-07-15 ENCOUNTER — Ambulatory Visit (INDEPENDENT_AMBULATORY_CARE_PROVIDER_SITE_OTHER): Payer: 59 | Admitting: Family Medicine

## 2019-07-15 DIAGNOSIS — R1030 Lower abdominal pain, unspecified: Secondary | ICD-10-CM | POA: Diagnosis not present

## 2019-07-15 NOTE — Progress Notes (Signed)
Chief Complaint  Patient presents with  . Abdominal Pain    Subjective: Patient is a 54 y.o. male here for Abd pain. Due to COVID-19 pandemic, we are interacting via web portal for an electronic face-to-face visit. I verified patient's ID using 2 identifiers. Patient agreed to proceed with visit via this method. Patient is at home, I am at office. Patient and I are present for visit.   1 day ago, the patient started having sharp lower abdominal pain.  Very similar to episode 2 weeks ago.  No injury or skin changes.  Bowel movements have been normal.  No nausea, vomiting, sick contacts, unintentional weight loss, or blood in stools.  He has leftover Bentyl from last visit that has been helpful.  He would like a letter for work.  No recent travel or medication changes.  Past Medical History:  Diagnosis Date  . Depression   . GERD (gastroesophageal reflux disease)   . History of heart attack 2013  . Hypertension   . Seasonal allergies     Objective: No conversational dyspnea Age appropriate judgment and insight Nml affect and mood   Assessment and Plan: Lower abdominal pain  Letter for work provided stating he can return tomorrow.  Question IBS symptoms.  He is up-to-date with colon cancer screening.  Continue Bentyl, if no improvement will consider IBS treatment versus referral to gastroenterology. The patient voiced understanding and agreement to the plan.  [place], DO 07/15/19  11:42 AM

## 2019-07-16 ENCOUNTER — Telehealth: Payer: Self-pay | Admitting: Family Medicine

## 2019-07-16 ENCOUNTER — Encounter: Payer: Self-pay | Admitting: Family Medicine

## 2019-07-16 NOTE — Telephone Encounter (Signed)
That's fine. Ty.  

## 2019-07-16 NOTE — Telephone Encounter (Signed)
Caller Luke Vasquez  Call Back # [phone]  Patient is having pain still in abdominal area and would like to extend work note until tomorrow. March 31,2021.

## 2019-07-16 NOTE — Telephone Encounter (Signed)
Completed and patient informed sent through my chart.

## 2019-07-23 ENCOUNTER — Other Ambulatory Visit: Payer: Self-pay

## 2019-07-24 ENCOUNTER — Encounter: Payer: Self-pay | Admitting: Family Medicine

## 2019-07-24 ENCOUNTER — Ambulatory Visit: Payer: 59 | Admitting: Family Medicine

## 2019-07-24 VITALS — BP 122/88 | HR 63 | Temp 96.1°F | Ht 70.0 in | Wt 234.5 lb

## 2019-07-24 DIAGNOSIS — M2550 Pain in unspecified joint: Secondary | ICD-10-CM | POA: Diagnosis not present

## 2019-07-24 DIAGNOSIS — R1084 Generalized abdominal pain: Secondary | ICD-10-CM | POA: Diagnosis not present

## 2019-07-24 DIAGNOSIS — G4700 Insomnia, unspecified: Secondary | ICD-10-CM | POA: Diagnosis not present

## 2019-07-24 MED ORDER — TRAZODONE HCL 50 MG PO TABS: 25.0000 mg | ORAL_TABLET | Freq: Every evening | ORAL | 3 refills | Status: AC | PRN

## 2019-07-24 NOTE — Progress Notes (Signed)
Chief Complaint  Patient presents with  . Abdominal Pain    all over pain    Subjective: Patient is a 54 y.o. male here for f/u abd pain.  Continues to have abd pain. Now more diffuse nad constant.  He is associated pain and swelling in various joints bilaterally.  He is taking Crestor daily and admits he does not drink very much water.  He has never tried stopping the medication.  Denies any injury, nausea, vomiting, diarrhea, unexplained weight changes.  He is not having fevers.  Over the past 6 months, the patient has been having issues waking up and being unable to fall back asleep.  Sometimes will have racing thoughts.  Does not snore or wake up gasping for air.  He does not nap routinely.  No excessive caffeine or alcohol.  Has tried Tylenol PM and melatonin without significant benefit.  Past Medical History:  Diagnosis Date  . Depression   . GERD (gastroesophageal reflux disease)   . History of heart attack 2013  . Hypertension   . Seasonal allergies     Objective: BP 122/88 (BP Location: Left Arm, Patient Position: Sitting, Cuff Size: Normal)   Pulse 63   Temp (!) 96.1 F (35.6 C) (Temporal)   Ht 5\' 10"  (1.778 m)   Wt 234 lb 8 oz (106.4 kg)   SpO2 96%   BMI 33.65 kg/m  General: Awake, appears stated age HEENT: MMM, EOMi Heart: RRR, no murmurs Lungs: CTAB, no rales, wheezes or rhonchi. No accessory muscle use Abdomen: Bowel sounds present, soft, mild lower quadrant tenderness to palpation, some lower left rib cage tenderness to palpation; + Carnett's sign Psych: Age appropriate judgment and insight, normal affect and mood  Assessment and Plan: Arthralgia, unspecified joint  Generalized abdominal pain  Insomnia, unspecified type - Plan: traZODone (DESYREL) 50 MG tablet  1/2- I suspect statin arthralgia/myopathy for #1 and #2.  We will stop Crestor for 2 weeks.  Hydration encouraged. 3-sleep hygiene verbalized and written down, counseling information provided as  well.  Trazodone sent in should these not be helpful.  I will see him in 2 months for this. The patient voiced understanding and agreement to the plan.  [place], DO 07/24/19  12:13 PM

## 2019-07-24 NOTE — Patient Instructions (Addendum)
Stop the Crestor for 2 weeks. Make sure to drink 55-60 oz of water daily, at least.  Keep the diet clean and stay active.  Sleep Hygiene Tips:  Do not watch TV or look at screens within 1 hour of going to bed. If you do, make sure there is a blue light filter (nighttime mode) involved.  Try to go to bed around the same time every night. Wake up at the same time within 1 hour of regular time. Ex: If you wake up at 7 AM for work, do not sleep past 8 AM on days that you don't work.  Do not drink alcohol before bedtime.  Do not consume caffeine-containing beverages after noon or within 9 hours of intended bedtime.  Get regular exercise/physical activity in your life, but not within 2 hours of planned bedtime.  Do not take naps.   Do not eat within 2 hours of planned bedtime.  Melatonin, 3-5 mg 30-60 minutes before planned bedtime may be helpful.   The bed should be for sleep or sex only. If after 20-30 minutes you are unable to fall asleep, get up and do something relaxing. Do this until you feel ready to go to sleep again.   Sleep is important to [place] all. Getting good sleep is imperative to adequate functioning during the day. Work with our counselors who are trained to help people obtain quality sleep. Call [phone] to schedule an appointment or if you are curious about insurance coverage/cost.  Let us know if you need anything.

## 2019-07-29 ENCOUNTER — Ambulatory Visit: Payer: 59 | Admitting: Family Medicine

## 2019-07-29 ENCOUNTER — Other Ambulatory Visit: Payer: Self-pay

## 2019-07-29 ENCOUNTER — Encounter: Payer: Self-pay | Admitting: Family Medicine

## 2019-07-29 DIAGNOSIS — M2550 Pain in unspecified joint: Secondary | ICD-10-CM | POA: Diagnosis not present

## 2019-07-29 LAB — COMPREHENSIVE METABOLIC PANEL
ALT: 34 U/L (ref 0–53)
AST: 20 U/L (ref 0–37)
Albumin: 4.4 g/dL (ref 3.5–5.2)
Alkaline Phosphatase: 37 U/L — ABNORMAL LOW (ref 39–117)
BUN: 23 mg/dL (ref 6–23)
CO2: 24 mEq/L (ref 19–32)
Calcium: 9.5 mg/dL (ref 8.4–10.5)
Chloride: 104 mEq/L (ref 96–112)
Creatinine, Ser: 1.12 mg/dL (ref 0.40–1.50)
GFR: 68.58 mL/min (ref >60.00)
Glucose, Bld: 135 mg/dL — ABNORMAL HIGH (ref 70–99)
Potassium: 4 mEq/L (ref 3.5–5.1)
Sodium: 138 mEq/L (ref 135–145)
Total Bilirubin: 0.7 mg/dL (ref 0.2–1.2)
Total Protein: 6.9 g/dL (ref 6.0–8.3)

## 2019-07-29 LAB — SEDIMENTATION RATE: Sed Rate: 4 mm/hr (ref 0–20)

## 2019-07-29 LAB — CBC
HCT: 47.6 % (ref 39.0–52.0)
Hemoglobin: 16.5 g/dL (ref 13.0–17.0)
MCHC: 34.6 g/dL (ref 30.0–36.0)
MCV: 93.2 fl (ref 78.0–100.0)
Platelets: 184 10*3/uL (ref 150.0–400.0)
RBC: 5.1 Mil/uL (ref 4.22–5.81)
RDW: 13.4 % (ref 11.5–15.5)
WBC: 5.7 10*3/uL (ref 4.0–10.5)

## 2019-07-29 LAB — CK: Total CK: 65 U/L (ref 7–232)

## 2019-07-29 LAB — TSH: TSH: 1.89 u[IU]/mL (ref 0.35–4.50)

## 2019-07-29 MED ORDER — PREDNISONE 20 MG PO TABS: 40.0000 mg | ORAL_TABLET | Freq: Every day | ORAL | 0 refills | Status: AC

## 2019-07-29 NOTE — Patient Instructions (Signed)
Stop meloxicam (or any NSAID) while on Prednisone.  Ice/cold pack over area for 10-15 min twice daily.  Heat (pad or rice pillow in microwave) over affected area, 10-15 minutes twice daily.    OK to take Tylenol 1000 mg (2 extra strength tabs) or 975 mg (3 regular strength tabs) every 6 hours as needed.  Give [place] 5-6 business days to get the results of your labs back. No news is good news.  Let us know if you need anything.

## 2019-07-29 NOTE — Progress Notes (Signed)
CC: Jt pain  Subjective: Patient is a 54 y.o. male here for Jt pain.  4 days ago, the patient started having bilateral hand pain, elbow pain, low back pain.  No injury or change in activity.  We did stop his Crestor.  Describes pain as an ache.  He notes some redness and swelling over his hands.  No family history of autoimmune disease.  He has increased his water intake.  No rashes, fevers.  Past Medical History:  Diagnosis Date  . Depression   . GERD (gastroesophageal reflux disease)   . History of heart attack 2013  . Hypertension   . Seasonal allergies     Objective: General: Awake, appears stated age 57: MMM, EOMi Heart: Brisk capillary refill Lungs: No accessory muscle use MSK: Diffuse bilateral tenderness to palpation including over the joints, there is mild erythema and swelling over the MCPs of the left hand and right hand; there is no deformity Psych: Age appropriate judgment and insight, normal affect and mood  Assessment and Plan: Arthralgia, unspecified joint - Plan: CBC, Comprehensive metabolic panel, Sedimentation rate, CK (Creatine Kinase), TSH, Cyclic citrul peptide antibody, IgG (QUEST), Anti-Smith antibody, Anti-DNA antibody, double-stranded, Rheumatoid Factor, Sjogrens syndrome-A extractable nuclear antibody, Sjogrens syndrome-B extractable nuclear antibody, Aldolase, Antinuclear Antib (ANA)  Check autoimmune labs as above.  Prednisone burst.  Stop anti-inflammatories while on the steroid.  Continue Tylenol and ice.  Continue pushing fluids and hold off on Crestor. The patient voiced understanding and agreement to the plan.  [place], DO 07/29/19  1:44 PM

## 2019-07-30 LAB — ANTI-DNA ANTIBODY, DOUBLE-STRANDED: ds DNA Ab: 1 IU/mL

## 2019-07-30 LAB — CYCLIC CITRUL PEPTIDE ANTIBODY, IGG: Cyclic Citrullin Peptide Ab: <16 UNITS

## 2019-07-30 LAB — ANA: Anti Nuclear Antibody (ANA): NEGATIVE

## 2019-07-30 LAB — SJOGRENS SYNDROME-A EXTRACTABLE NUCLEAR ANTIBODY: SSA (Ro) (ENA) Antibody, IgG: <1 AI

## 2019-07-30 LAB — ALDOLASE: Aldolase: 4.8 U/L (ref <=8.1)

## 2019-07-30 LAB — RHEUMATOID FACTOR: Rheumatoid fact SerPl-aCnc: <14 IU/mL (ref <14)

## 2019-07-30 LAB — ANTI-SMITH ANTIBODY: ENA SM Ab Ser-aCnc: <1 AI

## 2019-07-30 LAB — SJOGRENS SYNDROME-B EXTRACTABLE NUCLEAR ANTIBODY: SSB (La) (ENA) Antibody, IgG: <1 AI

## 2019-08-26 ENCOUNTER — Encounter: Payer: Self-pay | Admitting: Family Medicine

## 2019-08-26 ENCOUNTER — Ambulatory Visit (HOSPITAL_BASED_OUTPATIENT_CLINIC_OR_DEPARTMENT_OTHER)
Admission: RE | Admit: 2019-08-26 | Discharge: 2019-08-26 | Disposition: A | Discharge status: Home / Self Care | Payer: 59 | Source: Ambulatory Visit | Attending: Family Medicine | Admitting: Family Medicine

## 2019-08-26 ENCOUNTER — Other Ambulatory Visit: Payer: Self-pay

## 2019-08-26 ENCOUNTER — Ambulatory Visit: Payer: 59 | Admitting: Family Medicine

## 2019-08-26 VITALS — BP 108/82 | HR 66 | Temp 95.3°F | Ht 70.0 in | Wt 229.4 lb

## 2019-08-26 DIAGNOSIS — M545 Low back pain, unspecified: Secondary | ICD-10-CM

## 2019-08-26 DIAGNOSIS — R0789 Other chest pain: Secondary | ICD-10-CM | POA: Diagnosis not present

## 2019-08-26 DIAGNOSIS — S46812A Strain of other muscles, fascia and tendons at shoulder and upper arm level, left arm, initial encounter: Secondary | ICD-10-CM | POA: Diagnosis not present

## 2019-08-26 MED ORDER — TRAMADOL HCL 50 MG PO TABS: 50.0000 mg | ORAL_TABLET | Freq: Three times a day (TID) | ORAL | 0 refills | Status: AC | PRN

## 2019-08-26 NOTE — Patient Instructions (Signed)
Ice/cold pack over area for 10-15 min twice daily.  Heat (pad or rice pillow in microwave) over affected area, 10-15 minutes twice daily.   OK to take Tylenol 1000 mg (2 extra strength tabs) or 975 mg (3 regular strength tabs) every 6 hours as needed.  Ibuprofen 400-600 mg (2-3 over the counter strength tabs) every 6 hours as needed for pain.  Do not drink alcohol, do any illicit/street drugs, drive or do anything that requires alertness while on this medicine.   Let me know if you need more time off. I think you will feel much better in the next couple days.   We will be in touch regarding your X-ray results. In general, no news is good news.   Trapezius stretches/exercises Do exercises exactly as told by your health care provider and adjust them as directed. It is normal to feel mild stretching, pulling, tightness, or discomfort as you do these exercises, but you should stop right away if you feel sudden pain or your pain gets worse.  Stretching and range of motion exercises These exercises warm up your muscles and joints and improve the movement and flexibility of your shoulder. These exercises can also help to relieve pain, numbness, and tingling. If you are unable to do any of the following for any reason, do not further attempt to do it.   Exercise A: Flexion, standing    1. Stand and hold a broomstick, a cane, or a similar object. Place your hands a little more than shoulder-width apart on the object. Your left / right hand should be palm-up, and your other hand should be palm-down. 2. Push the stick to raise your left / right arm out to your side and then over your head. Use your other hand to help move the stick. Stop when you feel a stretch in your shoulder, or when you reach the angle that is recommended by your health care provider. ? Avoid shrugging your shoulder while you raise your arm. Keep your shoulder blade tucked down toward your spine. 3. Hold for 30  seconds. 4. Slowly return to the starting position. Repeat 2 times. Complete this exercise 3 times per week.  Exercise B: Abduction, supine    1. Lie on your back and hold a broomstick, a cane, or a similar object. Place your hands a little more than shoulder-width apart on the object. Your left / right hand should be palm-up, and your other hand should be palm-down. 2. Push the stick to raise your left / right arm out to your side and then over your head. Use your other hand to help move the stick. Stop when you feel a stretch in your shoulder, or when you reach the angle that is recommended by your health care provider. ? Avoid shrugging your shoulder while you raise your arm. Keep your shoulder blade tucked down toward your spine. 3. Hold for 30 seconds. 4. Slowly return to the starting position. Repeat 2 times. Complete this exercise 3 times per week.  Exercise C: Flexion, active-assisted    1. Lie on your back. You may bend your knees for comfort. 2. Hold a broomstick, a cane, or a similar object. Place your hands about shoulder-width apart on the object. Your palms should face toward your feet. 3. Raise the stick and move your arms over your head and behind your head, toward the floor. Use your healthy arm to help your left / right arm move farther. Stop when you feel a gentle  stretch in your shoulder, or when you reach the angle where your health care provider tells you to stop. 4. Hold for 30 seconds. 5. Slowly return to the starting position. Repeat 2 times. Complete this exercise 3 times per week.  Exercise D: External rotation and abduction    1. Stand in a door frame with one of your feet slightly in front of the other. This is called a staggered stance. 2. Choose one of the following positions as told by your health care provider: ? Place your hands and forearms on the door frame above your head. ? Place your hands and forearms on the door frame at the height of your  head. ? Place your hands on the door frame at the height of your elbows. 3. Slowly move your weight onto your front foot until you feel a stretch across your chest and in the front of your shoulders. Keep your head and chest upright and keep your abdominal muscles tight. 4. Hold for 30 seconds. 5. To release the stretch, shift your weight to your back foot. Repeat 2 times. Complete this stretch 3 times per week.  Strengthening exercises These exercises build strength and endurance in your shoulder. Endurance is the ability to use your muscles for a long time, even after your muscles get tired. Exercise E: Scapular depression and adduction  1. Sit on a stable chair. Support your arms in front of you with pillows, armrests, or a tabletop. Keep your elbows in line with the sides of your body. 2. Gently move your shoulder blades down toward your middle back. Relax the muscles on the tops of your shoulders and in the back of your neck. 3. Hold for 3 seconds. 4. Slowly release the tension and relax your muscles completely before doing this exercise again. Repeat for a total of 10 repetitions. 5. After you have practiced this exercise, try doing the exercise without the arm support. Then, try the exercise while standing instead of sitting. Repeat 2 times. Complete this exercise 3 times per week.  Exercise F: Shoulder abduction, isometric    1. Stand or sit about 4-6 inches (10-15 cm) from a wall with your left / right side facing the wall. 2. Bend your left / right elbow and gently press your elbow against the wall. 3. Increase the pressure slowly until you are pressing as hard as you can without shrugging your shoulder. 4. Hold for 3 seconds. 5. Slowly release the tension and relax your muscles completely. Repeat for a total of 10 repetitions. Repeat 2 times. Complete this exercise 3 times per week.  Exercise G: Shoulder flexion, isometric    1. Stand or sit about 4-6 inches (10-15 cm) away  from a wall with your left / right side facing the wall. 2. Keep your left / right elbow straight and gently press the top of your fist against the wall. Increase the pressure slowly until you are pressing as hard as you can without shrugging your shoulder. 3. Hold for 10-15 seconds. 4. Slowly release the tension and relax your muscles completely. Repeat for a total of 10 repetitions. Repeat 2 times. Complete this exercise 3 times per week.  Exercise H: Internal rotation    1. Sit in a stable chair without armrests, or stand. Secure an exercise band at your left / right side, at elbow height. 2. Place a soft object, such as a folded towel or a small pillow, under your left / right upper arm so your elbow  is a few inches (about 8 cm) away from your side. 3. Hold the end of the exercise band so the band stretches. 4. Keeping your elbow pressed against the soft object under your arm, move your forearm across your body toward your abdomen. Keep your body steady so the movement is only coming from your shoulder. 5. Hold for 3 seconds. 6. Slowly return to the starting position. Repeat for a total of 10 repetitions. Repeat 2 times. Complete this exercise 3 times per week.  Exercise I: External rotation    1. Sit in a stable chair without armrests, or stand. 2. Secure an exercise band at your left / right side, at elbow height. 3. Place a soft object, such as a folded towel or a small pillow, under your left / right upper arm so your elbow is a few inches (about 8 cm) away from your side. 4. Hold the end of the exercise band so the band stretches. 5. Keeping your elbow pressed against the soft object under your arm, move your forearm out, away from your abdomen. Keep your body steady so the movement is only coming from your shoulder. 6. Hold for 3 seconds. 7. Slowly return to the starting position. Repeat for a total of 10 repetitions. Repeat 2 times. Complete this exercise 3 times per  week. Exercise J: Shoulder extension  1. Sit in a stable chair without armrests, or stand. Secure an exercise band to a stable object in front of you so the band is at shoulder height. 2. Hold one end of the exercise band in each hand. Your palms should face each other. 3. Straighten your elbows and lift your hands up to shoulder height. 4. Step back, away from the secured end of the exercise band, until the band stretches. 5. Squeeze your shoulder blades together and pull your hands down to the sides of your thighs. Stop when your hands are straight down by your sides. Do not let your hands go behind your body. 6. Hold for 3 seconds. 7. Slowly return to the starting position. Repeat for a total of 10 repetitions. Repeat 2 times. Complete this exercise 3 times per week.  Exercise K: Shoulder extension, prone    1. Lie on your abdomen on a firm surface so your left / right arm hangs over the edge. 2. Hold a 5 lb weight in your hand so your palm faces in toward your body. Your arm should be straight. 3. Squeeze your shoulder blade down toward the middle of your back. 4. Slowly raise your arm behind you, up to the height of the surface that you are lying on. Keep your arm straight. 5. Hold for 3 seconds. 6. Slowly return to the starting position and relax your muscles. Repeat for a total of 10 repetitions. Repeat 2 times. Complete this exercise 3 times per week.   Exercise L: Horizontal abduction, prone  1. Lie on your abdomen on a firm surface so your left / right arm hangs over the edge. 2. Hold a 5 lb weight in your hand so your palm faces toward your feet. Your arm should be straight. 3. Squeeze your shoulder blade down toward the middle of your back. 4. Bend your elbow so your hand moves up, until your elbow is bent to an "L" shape (90 degrees). With your elbow bent, slowly move your forearm forward and up. Raise your hand up to the height of the surface that you are lying on. ? Your  upper  arm should not move, and your elbow should stay bent. ? At the top of the movement, your palm should face the floor. 5. Hold for 3 seconds. 6. Slowly return to the starting position and relax your muscles. Repeat for a total of 10 repetitions. Repeat 2 times. Complete this exercise 3 times per week.  Exercise M: Horizontal abduction, standing  1. Sit on a stable chair, or stand. 2. Secure an exercise band to a stable object in front of you so the band is at shoulder height. 3. Hold one end of the exercise band in each hand. 4. Straighten your elbows and lift your hands straight in front of you, up to shoulder height. Your palms should face down, toward the floor. 5. Step back, away from the secured end of the exercise band, until the band stretches. 6. Move your arms out to your sides, and keep your arms straight. 7. Hold for 3 seconds. 8. Slowly return to the starting position. Repeat for a total of 10 repetitions. Repeat 2 times. Complete this exercise 3 times per week.  Exercise N: Scapular retraction and elevation  1. Sit on a stable chair, or stand. 2. Secure an exercise band to a stable object in front of you so the band is at shoulder height. 3. Hold one end of the exercise band in each hand. Your palms should face each other. 4. Sit in a stable chair without armrests, or stand. 5. Step back, away from the secured end of the exercise band, until the band stretches. 6. Squeeze your shoulder blades together and lift your hands over your head. Keep your elbows straight. 7. Hold for 3 seconds. 8. Slowly return to the starting position. Repeat for a total of 10 repetitions. Repeat 2 times. Complete this exercise 3 times per week.  This information is not intended to replace advice given to you by your health care provider. Make sure you discuss any questions you have with your health care provider. Document Released: 04/04/2005 Document Revised: 12/10/2015 Document Reviewed:  02/19/2015 Elsevier Interactive Patient Education  2017 [employer].
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
## 2019-09-11 ENCOUNTER — Ambulatory Visit: Payer: Managed Care, Other (non HMO) | Admitting: Family Medicine

## 2019-09-11 ENCOUNTER — Other Ambulatory Visit: Payer: Self-pay

## 2019-09-11 ENCOUNTER — Encounter: Payer: Self-pay | Admitting: Family Medicine

## 2019-09-11 VITALS — BP 118/86 | HR 93 | Temp 95.3°F | Ht 70.0 in | Wt 223.5 lb

## 2019-09-11 DIAGNOSIS — R350 Frequency of micturition: Secondary | ICD-10-CM

## 2019-09-11 DIAGNOSIS — N410 Acute prostatitis: Secondary | ICD-10-CM

## 2019-09-11 DIAGNOSIS — H8110 Benign paroxysmal vertigo, unspecified ear: Secondary | ICD-10-CM | POA: Diagnosis not present

## 2019-09-11 LAB — POC URINALSYSI DIPSTICK (AUTOMATED)
Bilirubin, UA: NEGATIVE
Blood, UA: NEGATIVE
Glucose, UA: NEGATIVE
Ketones, UA: NEGATIVE
Leukocytes, UA: NEGATIVE
Nitrite, UA: NEGATIVE
Protein, UA: NEGATIVE
Spec Grav, UA: >=1.03 — AB (ref 1.010–1.025)
Urobilinogen, UA: 0.2 E.U./dL
pH, UA: 5 (ref 5.0–8.0)

## 2019-09-11 MED ORDER — SULFAMETHOXAZOLE-TRIMETHOPRIM 400-80 MG PO TABS: 1.0000 | ORAL_TABLET | Freq: Two times a day (BID) | ORAL | 0 refills | Status: AC

## 2019-09-11 NOTE — Progress Notes (Signed)
Chief Complaint  Patient presents with  . Dizziness    at work yesterday  . Back Pain  . Urinary Frequency    Luke Vasquez is 54 y.o. pt here for dizziness. Here w wife  Duration: 1 day Triggered by head movements, lasts for a few seconds. Pass out? No Spinning? Yes Recent illness/fever? No Headache? No Neurologic signs? No Change in PO intake? No  Not having today.    1 d of burning with urination, low back pain b/l, and freq. +hx of prostatitis. He is circumcised and denies hx of UTI/pyelo. No fevers, abd pain, constipation, bleeding, discharge, testicular pain.   Past Medical History:  Diagnosis Date  . Depression   . GERD (gastroesophageal reflux disease)   . History of heart attack 2013  . Hypertension   . Seasonal allergies     Family History  Problem Relation Age of Onset  . Hypertension Mother   . Gallbladder disease Mother   . Hypertension Father   . High blood pressure Brother   . Colon cancer Neg Hx   . Esophageal cancer Neg Hx   . Inflammatory bowel disease Neg Hx   . Liver disease Neg Hx   . Pancreatic cancer Neg Hx   . Rectal cancer Neg Hx   . Stomach cancer Neg Hx     Allergies as of 09/11/2019   No Known Allergies     Medication List       Accurate as of Sep 11, 2019 11:15 AM. If you have any questions, ask your nurse or doctor.        aspirin EC 81 MG tablet Take 81 mg by mouth daily.   cetirizine 10 MG tablet Commonly known as: ZYRTEC Take 1 tablet (10 mg total) by mouth daily.   famotidine 20 MG tablet Commonly known as: PEPCID Take 20 mg by mouth 2 (two) times daily.   fluticasone 50 MCG/ACT nasal spray Commonly known as: FLONASE Place 2 sprays into both nostrils daily.   lisinopril 10 MG tablet Commonly known as: ZESTRIL Take 1 tablet by mouth once daily   metoprolol tartrate 25 MG tablet Commonly known as: LOPRESSOR Take 0.5 tablets (12.5 mg total) by mouth 2 (two) times daily.   montelukast 10 MG  tablet Commonly known as: SINGULAIR Take 1 tablet (10 mg total) by mouth at bedtime.   omeprazole 20 MG capsule Commonly known as: PRILOSEC Take 1 capsule (20 mg total) by mouth 2 (two) times daily before a meal.   Ranexa 500 MG 12 hr tablet Generic drug: ranolazine Take 1 tablet (500 mg total) by mouth 2 (two) times daily.   rosuvastatin 20 MG tablet Commonly known as: CRESTOR Take 1 tablet by mouth once daily   sulfamethoxazole-trimethoprim 400-80 MG tablet Commonly known as: BACTRIM Take 1 tablet by mouth 2 (two) times daily for 14 days. Started by: Shelda Pal, DO   ticagrelor 60 MG Tabs tablet Commonly known as: BRILINTA Take 1 tablet (60 mg total) by mouth 2 (two) times daily.   traZODone 50 MG tablet Commonly known as: DESYREL Take 0.5-1 tablets (25-50 mg total) by mouth at bedtime as needed for sleep.   Vascepa 1 g capsule Generic drug: icosapent Ethyl Take 2 capsules (2 g total) by mouth 2 (two) times daily.       BP 118/86 (BP Location: Left Arm, Patient Position: Sitting, Cuff Size: Normal)   Pulse 93   Temp (!) 95.3 F (35.2 C) (Temporal)  Ht 5\' 10"  (1.778 m)   Wt 223 lb 8 oz (101.4 kg)   SpO2 97%   BMI 32.07 kg/m  General: Awake, alert, appears stated age Eyes: PERRLA, EOMi Ears: Patent, TM's neg b/l Heart: RRR, no murmurs, no carotid bruits Lungs: CTAB, no accessory muscle use MSK: 5/5 strength throughout, gait normal Neuro: No cerebellar signs, patellar reflex 2/4 b/l wo clonus, calcaneal reflex 0/4 b/l wo clonus, biceps reflex 1/4 b/l wo clonus; Dix-Hall-Pike negative b/l0 Abd: S, NT, ND. Psych: Age appropriate judgment and insight, normal mood and affect  Acute prostatitis - Plan: sulfamethoxazole-trimethoprim (BACTRIM) 400-80 MG tablet  Benign paroxysmal positional vertigo, unspecified laterality  Frequent urination - Plan: POCT Urinalysis Dipstick (Automated)  1- 2 weeks Bactrim. W hx of and similar presentation, will tx.   2- Reassurance as he likely corrected this himself. F/u prn. Pt and spouse voiced understanding and agreement to the plan.  [place], DO 09/11/19 11:15 AM

## 2019-09-11 NOTE — Addendum Note (Signed)
Addended by: Sharon Seller B on: 09/11/2019 12:46 PM   Modules accepted: Orders

## 2019-09-11 NOTE — Patient Instructions (Addendum)
Stay hydrated.  Let us know if you need anything.  How to Perform the Epley Maneuver The Epley maneuver is an exercise that relieves symptoms of vertigo. Vertigo is the feeling that you or your surroundings are moving when they are not. When you feel vertigo, you may feel like the room is spinning and have trouble walking. Dizziness is a little different than vertigo. When you are dizzy, you may feel unsteady or light-headed. You can do this maneuver at home whenever you have symptoms of vertigo. You can do it up to 3 times a day until your symptoms go away. Even though the Epley maneuver may relieve your vertigo for a few weeks, it is possible that your symptoms will return. This maneuver relieves vertigo, but it does not relieve dizziness. What are the risks? If it is done correctly, the Epley maneuver is considered safe. Sometimes it can lead to dizziness or nausea that goes away after a short time. If you develop other symptoms, such as changes in vision, weakness, or numbness, stop doing the maneuver and call your health care provider. How to perform the Epley maneuver 1. Sit on the edge of a bed or table with your back straight and your legs extended or hanging over the edge of the bed or table. 2. Turn your head halfway toward the affected ear or side. 3. Lie backward quickly with your head turned until you are lying flat on your back. You may want to position a pillow under your shoulders. 4. Hold this position for 30 seconds. You may experience an attack of vertigo. This is normal. 5. Turn your head to the opposite direction until your unaffected ear is facing the floor. 6. Hold this position for 30 seconds. You may experience an attack of vertigo. This is normal. Hold this position until the vertigo stops. 7. Turn your whole body to the same side as your head. Hold for another 30 seconds. 8. Sit back up. You can repeat this exercise up to 3 times a day. Follow these instructions at  home:  After doing the Epley maneuver, you can return to your normal activities.  Ask your health care provider if there is anything you should do at home to prevent vertigo. He or she may recommend that you: ? Keep your head raised (elevated) with two or more pillows while you sleep. ? Do not sleep on the side of your affected ear. ? Get up slowly from bed. ? Avoid sudden movements during the day. ? Avoid extreme head movement, like looking up or bending over. Contact a health care provider if:  Your vertigo gets worse.  You have other symptoms, including: ? Nausea. ? Vomiting. ? Headache. Get help right away if:  You have vision changes.  You have a severe or worsening headache or neck pain.  You cannot stop vomiting.  You have new numbness or weakness in any part of your body. Summary  Vertigo is the feeling that you or your surroundings are moving when they are not.  The Epley maneuver is an exercise that relieves symptoms of vertigo.  If the Epley maneuver is done correctly, it is considered safe. You can do it up to 3 times a day. This information is not intended to replace advice given to you by your health care provider. Make sure you discuss any questions you have with your health care provider. Document Revised: 03/17/2017 Document Reviewed: 02/23/2016 Elsevier Patient Education  2020 [employer].

## 2019-09-12 LAB — URINE CULTURE
MICRO NUMBER:: 10522425
Result:: NO GROWTH
SPECIMEN QUALITY:: ADEQUATE

## 2019-09-20 ENCOUNTER — Other Ambulatory Visit: Payer: Self-pay | Admitting: Family Medicine

## 2019-09-24 ENCOUNTER — Encounter: Payer: Self-pay | Admitting: Family Medicine

## 2019-09-24 ENCOUNTER — Telehealth (INDEPENDENT_AMBULATORY_CARE_PROVIDER_SITE_OTHER): Payer: 59 | Admitting: Family Medicine

## 2019-09-24 ENCOUNTER — Other Ambulatory Visit: Payer: Self-pay

## 2019-09-24 DIAGNOSIS — M545 Low back pain, unspecified: Secondary | ICD-10-CM

## 2019-09-24 MED ORDER — CYCLOBENZAPRINE HCL 10 MG PO TABS: 10.0000 mg | ORAL_TABLET | Freq: Three times a day (TID) | ORAL | 0 refills | Status: DC | PRN

## 2019-09-24 MED ORDER — PREDNISONE 10 MG PO TABS: ORAL_TABLET | ORAL | 0 refills | Status: DC

## 2019-09-24 NOTE — Progress Notes (Signed)
Virtual Visit via Video Note  I connected with Luke Vasquez on 09/24/19 at  3:20 PM EDT by a video enabled telemedicine application and verified that I am speaking with the correct person using two identifiers.  Location: Patient: home alone  Provider: office    I discussed the limitations of evaluation and management by telemedicine and the availability of in person appointments. The patient expressed understanding and agreed to proceed.  History of Present Illness: Pt is home  c/o low back pain --- he was moving furniture sat afternoon and his back started hurting Sun early morning and he has had spasms  Stairs are hard-- he takes one at a time  otc meds not helping  Observations/Objective: There were no vitals filed for this visit. Pt is in nad He feels his legs are weak and has to take stairs slow  Assessment and Plan: 1. Acute right-sided low back pain without sciatica pred taper and muscle relaxer  Ice / alt with heat  Note for work written  F/u prn  - predniSONE (DELTASONE) 10 MG tablet; TAKE 3 TABLETS PO QD FOR 3 DAYS THEN TAKE 2 TABLETS PO QD FOR 3 DAYS THEN TAKE 1 TABLET PO QD FOR 3 DAYS THEN TAKE 1/2 TAB PO QD FOR 3 DAYS  Dispense: 20 tablet; Refill: 0 - cyclobenzaprine (FLEXERIL) 10 MG tablet; Take 1 tablet (10 mg total) by mouth 3 (three) times daily as needed for muscle spasms.  Dispense: 30 tablet; Refill: 0   Follow Up Instructions:    I discussed the assessment and treatment plan with the patient. The patient was provided an opportunity to ask questions and all were answered. The patient agreed with the plan and demonstrated an understanding of the instructions.   The patient was advised to call back or seek an in-person evaluation if the symptoms worsen or if the condition fails to improve as anticipated.  I provided 25 minutes of non-face-to-face time during this encounter.   Ann Held, DO

## 2019-09-27 ENCOUNTER — Ambulatory Visit: Payer: 59 | Admitting: Family Medicine

## 2019-09-30 ENCOUNTER — Ambulatory Visit: Payer: 59 | Admitting: Family Medicine

## 2019-10-14 ENCOUNTER — Ambulatory Visit: Payer: 59 | Admitting: Family Medicine

## 2019-10-14 ENCOUNTER — Other Ambulatory Visit: Payer: Self-pay

## 2019-10-14 ENCOUNTER — Encounter: Payer: Self-pay | Admitting: Family Medicine

## 2019-10-14 VITALS — BP 120/84 | HR 77 | Temp 96.5°F | Ht 70.0 in | Wt 233.2 lb

## 2019-10-14 DIAGNOSIS — T24012A Burn of unspecified degree of left thigh, initial encounter: Secondary | ICD-10-CM

## 2019-10-14 DIAGNOSIS — X16XXXA Contact with hot heating appliances, radiators and pipes, initial encounter: Secondary | ICD-10-CM

## 2019-10-14 DIAGNOSIS — T300 Burn of unspecified body region, unspecified degree: Secondary | ICD-10-CM

## 2019-10-14 DIAGNOSIS — L089 Local infection of the skin and subcutaneous tissue, unspecified: Secondary | ICD-10-CM | POA: Diagnosis not present

## 2019-10-14 MED ORDER — CEPHALEXIN 500 MG PO CAPS: 500.0000 mg | ORAL_CAPSULE | Freq: Three times a day (TID) | ORAL | 0 refills | Status: AC
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
## 2019-10-14 NOTE — Patient Instructions (Signed)
When you do wash it, use only soap and water. Do not vigorously scrub. Apply Vaseline twice daily. Keep the area clean and dry.   Things to look out for: increasing pain not relieved by ibuprofen/acetaminophen, fevers, spreading redness, drainage of pus, or foul odor.  Let us know if you need anything.

## 2019-10-21 ENCOUNTER — Other Ambulatory Visit: Payer: Self-pay | Admitting: Family Medicine

## 2019-10-29 ENCOUNTER — Encounter: Payer: Self-pay | Admitting: Family Medicine

## 2019-10-29 ENCOUNTER — Other Ambulatory Visit: Payer: Self-pay

## 2019-10-29 ENCOUNTER — Telehealth (INDEPENDENT_AMBULATORY_CARE_PROVIDER_SITE_OTHER): Payer: 59 | Admitting: Family Medicine

## 2019-10-29 ENCOUNTER — Telehealth: Payer: Self-pay | Admitting: Family Medicine

## 2019-10-29 VITALS — Ht 70.0 in | Wt 227.0 lb

## 2019-10-29 DIAGNOSIS — R0981 Nasal congestion: Secondary | ICD-10-CM

## 2019-10-29 DIAGNOSIS — J0100 Acute maxillary sinusitis, unspecified: Secondary | ICD-10-CM | POA: Diagnosis not present

## 2019-10-29 MED ORDER — METHYLPREDNISOLONE 4 MG PO TBPK: ORAL_TABLET | ORAL | 0 refills | Status: DC

## 2019-10-29 NOTE — Progress Notes (Signed)
Chief Complaint  Patient presents with  . Sinusitis    Luke Vasquez here for URI complaints. Due to COVID-19 pandemic, we are interacting via web portal for an electronic face-to-face visit. I verified patient's ID using 2 identifiers. Patient agreed to proceed with visit via this method. Patient is at home, I am at office. Patient and I are present for visit.   Duration: 4 days  Associated symptoms: sinus congestion, rhinorrhea, itchy watery eyes, sinus pressure behind eyes, wheezing and some cough Denies: sinus pain, ear pain, ear drainage, sore throat, shortness of breath, myalgia and fevers Treatment to date: Keflex, INCS Sick contacts: No   Pt has a hx of chronic nasal congestion outside of current issue and is requesting to see a specialist.   Past Medical History:  Diagnosis Date  . Depression   . GERD (gastroesophageal reflux disease)   . History of heart attack 2013  . Hypertension   . Seasonal allergies     Ht 5\' 10"  (1.778 m)   Wt 227 lb (103 kg)   BMI 32.57 kg/m  No conversational dyspnea Age appropriate judgment and insight Nml affect and mood  Acute maxillary sinusitis, recurrence not specified - Plan: methylPREDNISolone (MEDROL DOSEPAK) 4 MG TBPK tablet  Chronic nasal congestion - Plan: Ambulatory referral to ENT  Continue to push fluids, practice good hand hygiene, cover mouth when coughing. F/u prn. If starting to experience fevers, shaking, or shortness of breath, seek immediate care. Pt voiced understanding and agreement to the plan.  [place], DO 10/29/19 8:59 AM

## 2019-10-29 NOTE — Telephone Encounter (Signed)
Error

## 2019-11-12 ENCOUNTER — Telehealth (INDEPENDENT_AMBULATORY_CARE_PROVIDER_SITE_OTHER): Payer: 59 | Admitting: Medical

## 2019-11-12 ENCOUNTER — Other Ambulatory Visit: Payer: Self-pay

## 2019-11-12 DIAGNOSIS — R059 Cough, unspecified: Secondary | ICD-10-CM

## 2019-11-12 DIAGNOSIS — J309 Allergic rhinitis, unspecified: Secondary | ICD-10-CM

## 2019-11-12 DIAGNOSIS — R05 Cough: Secondary | ICD-10-CM

## 2019-11-12 DIAGNOSIS — J329 Chronic sinusitis, unspecified: Secondary | ICD-10-CM

## 2019-11-12 MED ORDER — BENZONATATE 100 MG PO CAPS: 100.0000 mg | ORAL_CAPSULE | Freq: Three times a day (TID) | ORAL | 0 refills | Status: DC | PRN

## 2019-11-12 MED ORDER — DOXYCYCLINE HYCLATE 100 MG PO TABS
100.0000 mg | ORAL_TABLET | Freq: Two times a day (BID) | ORAL | 0 refills | Status: DC
Start: 2019-11-12 — End: 2019-11-25

## 2019-11-12 MED ORDER — AZELASTINE HCL 0.1 % NA SOLN: 2.0000 | Freq: Two times a day (BID) | NASAL | 3 refills | Status: DC

## 2019-11-12 NOTE — Progress Notes (Signed)
Subjective:    Patient ID: Luke Vasquez, male    DOB: 11-23-1965, 54 y.o.   MRN: [id number]  HPI  Virtual Visit via Video Note  I connected with ARNAV CREGG on 11/12/19 at 11:00 AM EDT by a video enabled telemedicine application and verified that I am speaking with the correct person using two identifiers.  Location: Patient: home [place] Provider: office   I discussed the limitations of evaluation and management by telemedicine and the availability of in person appointments. The patient expressed understanding and agreed to proceed.  Pt did not check bp, pulse or temp today. Pt just moved.  History of Present Illness:  Pt describes that he has off and on sinus infection. Appointment with specialist ENT in august. Several years ago he considered sinus surgery. At least 6-8 infection each year.  Pt states most recent flare of sinus started yesterday morning with nasal congestion, dizziness and pnd. Pt has frontal sinus pressure and maxillary sinus pressure. Has moderate cough. Pt has no wheezing or sob.   Pt is on flonase, montelukast and zyrtec as well. Hx of allergies.  No fever, no chills or sweats.   Some loose stools since this morning. No muscles aches. Does feel tired.  Pt can smell and taste food.  Pt never had covid vaccine.     Observations/Objective:  General-no acute distress, pleasant, oriented. Lungs- on inspection lungs appear unlabored. Neck- no tracheal deviation or jvd on inspection. Neuro- gross motor function appears intact. heent- frontal and maxillary sinus pressure.    Assessment and Plan: You have history of chronic recurrent sinus infections with history of allergic rhinitis. Recent early signs symptoms indicating probable sinus infection over the past 2 days.  Will prescribe doxycycline oral antibiotic.  Also sent in benzonatate cough tablets and Astelin nasal spray.  You are on 3 allergy type medications and adding Astelin would probably  be beneficial.  Some concern that your son symptoms could represent COVID as you are not vaccinated.  Early in evaluation/treatment so recommend see how you do with above treatment.  If not significantly improving by Friday then recommend [facility].  If possible recommend getting both rapid test and send out test.  I did send my chart return to work note asking to excuse for Monday through Friday.  Return the Sunday if improved.  Might need to extend days if needed not feeling better.  Follow-up in 7 to 10 days or as needed  Time spent with patient today was  25  minutes which consisted of chart review, discussing diagnosis, work up, treatment and documentation.  Follow Up Instructions:    I discussed the assessment and treatment plan with the patient. The patient was provided an opportunity to ask questions and all were answered. The patient agreed with the plan and demonstrated an understanding of the instructions.   The patient was advised to call back or seek an in-person evaluation if the symptoms worsen or if the condition fails to improve as anticipated.    Mackie Pai, PA-C   Review of Systems  Constitutional: Negative for chills, fatigue and fever.  HENT: Positive for congestion, sinus pressure and sinus pain. Negative for sneezing and sore throat.   Respiratory: Positive for cough. Negative for shortness of breath and wheezing.   Cardiovascular: Negative for chest pain and palpitations.  Gastrointestinal: Negative for abdominal pain.  Musculoskeletal: Negative for myalgias.  Skin: Negative for rash.  Neurological: Negative for dizziness and  headaches.  Hematological: Negative for adenopathy. Does not bruise/bleed easily.  Psychiatric/Behavioral: Negative for behavioral problems.       Objective:   Physical Exam        Assessment & Plan:

## 2019-11-12 NOTE — Patient Instructions (Signed)
You have history of chronic recurrent sinus infections with history of allergic rhinitis. Recent early signs symptoms indicating probable sinus infection over the past 2 days.  Will prescribe doxycycline oral antibiotic.  Also sent in benzonatate cough tablets and Astelin nasal spray.  You are on 3 allergy type medications and adding Astelin would probably be beneficial.  Some concern that your son symptoms could represent COVID as you are not vaccinated.  Early in evaluation/treatment so recommend see how you do with above treatment.  If not significantly improving by Friday then [facility]  If possible recommend getting both rapid test and send out test.  I did send my chart return to work note asking to excuse for Monday through Friday.  Return the Sunday if improved.  Might need to extend days if needed not feeling better.  Follow-up in 7 to 10 days or as needed

## 2019-11-18 ENCOUNTER — Telehealth: Payer: Self-pay | Admitting: Family Medicine

## 2019-11-18 NOTE — Telephone Encounter (Signed)
Pt notified and he is going to get tested

## 2019-11-18 NOTE — Telephone Encounter (Signed)
On review of my note plan was for him to get tested for covid both rapid and send out on Friday if not feeling better. So [facility] and send out covid test.  I don't want to put him back to work just yet if still not feeling well and no testing.

## 2019-11-18 NOTE — Telephone Encounter (Signed)
Caller: [name] Call back # [phone]   Patient states he was given a work note to return 11/17/19, however, he still not feeling well and was unable to return. Patient states he does not have any new symptoms just not feeling 100 % better. Patient is wondering if he could get the note extended till tomorrow  11/19/19.

## 2019-11-19 ENCOUNTER — Telehealth: Payer: Self-pay | Admitting: Family Medicine

## 2019-11-19 NOTE — Telephone Encounter (Signed)
Patient is covid negative and would like to go to back on August 8,2021. Patient states needs work note extended.   Please Advise

## 2019-11-19 NOTE — Telephone Encounter (Signed)
I had asked pt to get both rapid and send out covid test. Did he only do a rapid test? Let me know.

## 2019-11-20 NOTE — Telephone Encounter (Signed)
Called pt and lvm to return call.  

## 2019-11-20 NOTE — Telephone Encounter (Signed)
Patient is doing better , no symptoms

## 2019-11-20 NOTE — Telephone Encounter (Signed)
Had want both but  just verify he feels substantially better. The 8th would be 12 days after onset of symptoms so if feels better reasonable to return.  Let me know feels good and will send my chart return to work note.

## 2019-11-20 NOTE — Telephone Encounter (Signed)
Pt only did rapid

## 2019-11-21 ENCOUNTER — Other Ambulatory Visit: Payer: Self-pay

## 2019-11-21 ENCOUNTER — Ambulatory Visit (INDEPENDENT_AMBULATORY_CARE_PROVIDER_SITE_OTHER): Payer: 59 | Admitting: Otolaryngology

## 2019-11-21 ENCOUNTER — Other Ambulatory Visit (INDEPENDENT_AMBULATORY_CARE_PROVIDER_SITE_OTHER): Payer: Self-pay

## 2019-11-21 ENCOUNTER — Encounter (INDEPENDENT_AMBULATORY_CARE_PROVIDER_SITE_OTHER): Payer: Self-pay | Admitting: Otolaryngology

## 2019-11-21 VITALS — Temp 97.0°F

## 2019-11-21 DIAGNOSIS — J310 Chronic rhinitis: Secondary | ICD-10-CM

## 2019-11-21 DIAGNOSIS — J329 Chronic sinusitis, unspecified: Secondary | ICD-10-CM

## 2019-11-21 NOTE — Telephone Encounter (Signed)
Work note is signed as Dr.Wendling and not [employer]

## 2019-11-21 NOTE — Progress Notes (Signed)
HPI: Luke Vasquez is a 54 y.o. male who presents is referred by his PCP for evaluation of recurrent sinus infections.  Patient states that he has had 7 or 8 sinus infections this year where he is treated with antibiotics and prednisone.  He complains that he wakes up congested in the mornings.  He complains of pressure in the forehead area as well as pressure behind his eyes.  He has used a nasal steroid sprays as well as Allegra.  But he keeps having recurrent "infections".  He does have history of allergies.  He is presently on azelastine, Flonase, Singulair and Zyrtec.Marland Kitchen  Past Medical History:  Diagnosis Date  . Depression   . GERD (gastroesophageal reflux disease)   . History of heart attack 2013  . Hypertension   . Seasonal allergies    Past Surgical History:  Procedure Laterality Date  . CORONARY STENT PLACEMENT  2013  . LITHOTRIPSY     Social History   Socioeconomic History  . Marital status: Single    Spouse name: Not on file  . Number of children: Not on file  . Years of education: Not on file  . Highest education level: Not on file  Occupational History  . Not on file  Tobacco Use  . Smoking status: Former Smoker    Packs/day: 0.25    Years: 0.60    Pack years: 0.15  . Smokeless tobacco: Never Used  . Tobacco comment: Briefly smoked  Substance and Sexual Activity  . Alcohol use: Yes    Comment: socially  . Drug use: No  . Sexual activity: Not on file  Other Topics Concern  . Not on file  Social History Narrative  . Not on file   Social Determinants of Health   Financial Resource Strain:   . Difficulty of Paying Living Expenses:   Food Insecurity:   . Worried About Charity fundraiser in the Last Year:   . Arboriculturist in the Last Year:   Transportation Needs:   . Film/video editor (Medical):   Marland Kitchen Lack of Transportation (Non-Medical):   Physical Activity:   . Days of Exercise per Week:   . Minutes of Exercise per Session:   Stress:   .  Feeling of Stress :   Social Connections:   . Frequency of Communication with Friends and Family:   . Frequency of Social Gatherings with Friends and Family:   . Attends Religious Services:   . Active Member of Clubs or Organizations:   . Attends Archivist Meetings:   Marland Kitchen Marital Status:    Family History  Problem Relation Age of Onset  . Hypertension Mother   . Gallbladder disease Mother   . Hypertension Father   . High blood pressure Brother   . Colon cancer Neg Hx   . Esophageal cancer Neg Hx   . Inflammatory bowel disease Neg Hx   . Liver disease Neg Hx   . Pancreatic cancer Neg Hx   . Rectal cancer Neg Hx   . Stomach cancer Neg Hx    No Known Allergies Prior to Admission medications   Medication Sig Start Date End Date Taking? Authorizing Provider  aspirin EC 81 MG tablet Take 81 mg by mouth daily.   Yes [provider]  azelastine (ASTELIN) 0.1 % nasal spray Place 2 sprays into both nostrils 2 (two) times daily. Use in each nostril as directed 11/12/19  Yes Saguier, Percell Miller, PA-C  benzonatate (TESSALON)  100 MG capsule Take 1 capsule (100 mg total) by mouth 3 (three) times daily as needed for cough. 11/12/19  Yes Saguier, Percell Miller, PA-C  cetirizine (ZYRTEC) 10 MG tablet Take 1 tablet (10 mg total) by mouth daily. 10/10/17  Yes Shelda Pal, DO  doxycycline (VIBRA-TABS) 100 MG tablet Take 1 tablet (100 mg total) by mouth 2 (two) times daily. 11/12/19  Yes Saguier, Percell Miller, PA-C  famotidine (PEPCID) 20 MG tablet Take 20 mg by mouth 2 (two) times daily. 11/02/17  Yes [provider]  fluticasone (FLONASE) 50 MCG/ACT nasal spray Place 2 sprays into both nostrils daily. 07/19/18  Yes Wendling, Crosby Oyster, DO  icosapent Ethyl (VASCEPA) 1 g capsule Take 2 capsules (2 g total) by mouth 2 (two) times daily. 04/03/19  Yes Shelda Pal, DO  lisinopril (ZESTRIL) 10 MG tablet Take 1 tablet by mouth once daily 06/28/19  Yes Wendling, Crosby Oyster, DO   methylPREDNISolone (MEDROL DOSEPAK) 4 MG TBPK tablet Follow instructions on package. 10/29/19  Yes Shelda Pal, DO  metoprolol tartrate (LOPRESSOR) 25 MG tablet Take 0.5 tablets (12.5 mg total) by mouth 2 (two) times daily. 04/03/19  Yes Shelda Pal, DO  montelukast (SINGULAIR) 10 MG tablet TAKE 1 TABLET BY MOUTH AT BEDTIME 10/22/19  Yes Shelda Pal, DO  omeprazole (PRILOSEC) 20 MG capsule Take 1 capsule (20 mg total) by mouth 2 (two) times daily before a meal. 10/25/18  Yes Wendling, Crosby Oyster, DO  ranolazine (RANEXA) 500 MG 12 hr tablet Take 1 tablet (500 mg total) by mouth 2 (two) times daily. 04/03/19  Yes Shelda Pal, DO  rosuvastatin (CRESTOR) 20 MG tablet Take 1 tablet by mouth once daily 02/11/19  Yes Wendling, Crosby Oyster, DO  ticagrelor (BRILINTA) 60 MG TABS tablet Take 1 tablet (60 mg total) by mouth 2 (two) times daily. 04/03/19  Yes Shelda Pal, DO  traZODone (DESYREL) 50 MG tablet Take 0.5-1 tablets (25-50 mg total) by mouth at bedtime as needed for sleep. 07/24/19  Yes Shelda Pal, DO     Positive ROS: Otherwise negative  All other systems have been reviewed and were otherwise negative with the exception of those mentioned in the HPI and as above.  Physical Exam: Constitutional: Alert, well-appearing, no acute distress Ears: External ears without lesions or tenderness. Ear canals are clear bilaterally with intact, clear TMs.  Nasal: External nose without lesions. Septum mild to moderate septal deformity with moderate rhinitis..  After decongesting the nose with Afrin nasal endoscopy was performed and on nasal endoscopy both the middle meatus regions were clear with no polyps and no clinical signs of active infection.  The posterior ethmoid and sphenoid region were clear with no gross evidence of mucopurulent discharge. Oral: Lips and gums without lesions. Tongue and palate mucosa without lesions. Posterior  oropharynx clear. Neck: No palpable adenopathy or masses Respiratory: Breathing comfortably  Skin: No facial/neck lesions or rash noted.  Nasal/sinus endoscopy  Date/Time: 11/21/2019 7:09 PM Performed by: Rozetta Nunnery, MD Authorized by: Rozetta Nunnery, MD   Consent:    Consent obtained:  Verbal   Consent given by:  Patient Procedure details:    Indications: sino-nasal symptoms     Medication:  Afrin   Instrument: flexible fiberoptic nasal endoscope   Sinus:    Right middle meatus: normal     Left middle meatus: normal     Right nasopharynx: normal     Left nasopharynx: normal   Comments:  On nasal endoscopy patient had moderate mucosal edema and moderate septal deviation and turbinate hypertrophy.  No polyps were noted.  In no clinical evidence of acute sinus infection.    Assessment: Chronic rhinitis with history of recurrent sinus infections  Plan: We will plan on scheduling a CT scan of the sinuses and have him follow-up following the CT scan of the sinuses to discuss possible surgical intervention if warranted as he has not done well with medical therapy.   Radene Journey, MD   CC:

## 2019-11-21 NOTE — Telephone Encounter (Signed)
Letter resent with Edward's name as provider

## 2019-11-21 NOTE — Telephone Encounter (Signed)
Work note return on 11-24-2019 sent to pt my chart.

## 2019-11-21 NOTE — Telephone Encounter (Signed)
Patient would like an update on a doctor;s note.

## 2019-11-21 NOTE — Telephone Encounter (Signed)
I sent the letter. Should be my name?? Reprint with my name. Use my stamp. Can't print under Dr.Wendling. don't understand

## 2019-11-22 ENCOUNTER — Other Ambulatory Visit (INDEPENDENT_AMBULATORY_CARE_PROVIDER_SITE_OTHER): Payer: Self-pay

## 2019-11-22 DIAGNOSIS — J329 Chronic sinusitis, unspecified: Secondary | ICD-10-CM

## 2019-11-25 ENCOUNTER — Encounter: Payer: Self-pay | Admitting: Family Medicine

## 2019-11-25 ENCOUNTER — Other Ambulatory Visit: Payer: Self-pay

## 2019-11-25 ENCOUNTER — Ambulatory Visit: Payer: 59 | Admitting: Family Medicine

## 2019-11-25 ENCOUNTER — Telehealth (INDEPENDENT_AMBULATORY_CARE_PROVIDER_SITE_OTHER): Payer: Self-pay

## 2019-11-25 VITALS — BP 128/80 | HR 94 | Temp 97.5°F | Ht 70.0 in | Wt 232.0 lb

## 2019-11-25 DIAGNOSIS — J0141 Acute recurrent pansinusitis: Secondary | ICD-10-CM

## 2019-11-25 DIAGNOSIS — R1013 Epigastric pain: Secondary | ICD-10-CM | POA: Diagnosis not present

## 2019-11-25 MED ORDER — MONTELUKAST SODIUM 10 MG PO TABS: 10.0000 mg | ORAL_TABLET | Freq: Every day | ORAL | 3 refills | Status: DC

## 2019-11-25 MED ORDER — METOPROLOL TARTRATE 25 MG PO TABS: 12.5000 mg | ORAL_TABLET | Freq: Two times a day (BID) | ORAL | 3 refills | Status: DC

## 2019-11-25 MED ORDER — LISINOPRIL 10 MG PO TABS: 10.0000 mg | ORAL_TABLET | Freq: Every day | ORAL | 0 refills | Status: DC

## 2019-11-25 MED ORDER — RANOLAZINE ER 500 MG PO TB12: 500.0000 mg | ORAL_TABLET | Freq: Two times a day (BID) | ORAL | 3 refills | Status: DC

## 2019-11-25 MED ORDER — ICOSAPENT ETHYL 1 G PO CAPS: 2.0000 g | ORAL_CAPSULE | Freq: Two times a day (BID) | ORAL | 3 refills | Status: DC

## 2019-11-25 MED ORDER — TICAGRELOR 60 MG PO TABS: 60.0000 mg | ORAL_TABLET | Freq: Two times a day (BID) | ORAL | 3 refills | Status: DC

## 2019-11-25 MED ORDER — AMOXICILLIN-POT CLAVULANATE 875-125 MG PO TABS: 1.0000 | ORAL_TABLET | Freq: Two times a day (BID) | ORAL | 0 refills | Status: AC

## 2019-11-25 MED ORDER — METHYLPREDNISOLONE 4 MG PO TBPK: ORAL_TABLET | ORAL | 0 refills | Status: DC

## 2019-11-25 MED ORDER — OMEPRAZOLE 20 MG PO CPDR: 20.0000 mg | DELAYED_RELEASE_CAPSULE | Freq: Two times a day (BID) | ORAL | 3 refills | Status: DC

## 2019-11-25 MED ORDER — METHYLPREDNISOLONE ACETATE 80 MG/ML IJ SUSP
80.0000 mg | Freq: Once | INTRAMUSCULAR | Status: AC
  Administered 2019-11-25: 80 mg via INTRAMUSCULAR

## 2019-11-25 NOTE — Patient Instructions (Signed)
Continue to push fluids, practice good hand hygiene, and cover your mouth if you cough.  If you start having fevers, shaking or shortness of breath, seek immediate care.  Start the Medrol Dosepak tomorrow.  Let us know if you need anything.

## 2019-11-25 NOTE — Progress Notes (Signed)
Chief Complaint  Patient presents with  . Ear Pain    left    Pt is here for sinus issues. Duration: 10+ days Progression: worse Associated symptoms: congestion, runny/stuff nose, fever of 101.2 F, and productive cough Denies: sob, wheezing, , bleeding, or discharge from ear Treatment to date: Tylenol, doxy, Tessalon Perles  Past Medical History:  Diagnosis Date  . Depression   . GERD (gastroesophageal reflux disease)   . History of heart attack 2013  . Hypertension   . Seasonal allergies     BP 128/80 (BP Location: Left Arm, Patient Position: Sitting, Cuff Size: Normal)   Pulse 94   Temp (!) 97.5 F (36.4 C) (Oral)   Ht 5\' 10"  (1.778 m)   Wt 232 lb (105.2 kg)   SpO2 95%   BMI 33.29 kg/m  General: Awake, alert, appearing stated age HEENT:  L ear- Canal patent without drainage or erythema, TM is neg R ear- canal patent without drainage or erythema, TM is neg Nose- nares patent and with clear discharge; frontal and max sinuses ttp b/l Mouth- Lips, gums and dentition unremarkable, pharynx is without erythema or exudate Neck: No adenopathy Heart: RRR Lungs: Normal effort, CTAB, no accessory muscle use Psych: Age appropriate judgment and insight, normal mood and affect  Recurrent pansinusitis - Plan: methylPREDNISolone (MEDROL DOSEPAK) 4 MG TBPK tablet, amoxicillin-clavulanate (AUGMENTIN) 875-125 MG tablet, methylPREDNISolone acetate (DEPO-MEDROL) injection 80 mg  Epigastric abdominal pain - Plan: omeprazole (PRILOSEC) 20 MG capsule  Failed Doxy, will try Augmentin. Dosepak starting tomorrow. He is undergoing workup for sinus surgery.  F/u prn.  Pt voiced understanding and agreement to the plan.  [place], DO 11/25/19 11:34 AM

## 2019-11-26 ENCOUNTER — Telehealth: Payer: Self-pay | Admitting: Family Medicine

## 2019-11-26 NOTE — Telephone Encounter (Signed)
Called left message to call back 

## 2019-11-26 NOTE — Telephone Encounter (Signed)
Called informed of PCP's instruction

## 2019-11-26 NOTE — Telephone Encounter (Signed)
Give the medicine more time to work. Let me know if having issues on Th. Ty.

## 2019-11-26 NOTE — Telephone Encounter (Signed)
Patient calling inreference to not feeling well since yesterday appointment. Patient states coughing and sinus pressure.

## 2019-11-27 ENCOUNTER — Encounter: Payer: Self-pay | Admitting: Family Medicine

## 2019-11-27 ENCOUNTER — Telehealth: Payer: Self-pay | Admitting: Family Medicine

## 2019-11-27 NOTE — Telephone Encounter (Signed)
PCP completed [employer] short Term disability paperwork Faxed Received fax confirmation. Sent to scan Patient informed

## 2019-11-27 NOTE — Telephone Encounter (Signed)
Caller: Tyrin Call back # [phone]  Patient states he needs a doctor's note from 11-24-19 through 12/12/19 for work.

## 2019-11-27 NOTE — Telephone Encounter (Signed)
Called the patient informed of PCP response. He stated that work just needed something to show the time off. Completed the letter and sent through my chart.

## 2019-11-27 NOTE — Telephone Encounter (Signed)
Really? I am fine to do, but we just filled out [facility] paperwork for this.

## 2019-11-29 ENCOUNTER — Telehealth (INDEPENDENT_AMBULATORY_CARE_PROVIDER_SITE_OTHER): Payer: Self-pay

## 2019-11-29 ENCOUNTER — Telehealth: Payer: Self-pay | Admitting: Family Medicine

## 2019-11-29 ENCOUNTER — Telehealth: Payer: 59 | Admitting: Family Medicine

## 2019-11-29 NOTE — Telephone Encounter (Signed)
OK 

## 2019-11-29 NOTE — Telephone Encounter (Signed)
Let me know if ok to cancel referral

## 2019-11-29 NOTE — Telephone Encounter (Signed)
Caller: Thayer Jew  Patient was scheduled for a CT in [place]. The patient is now going to [place] they trying to get CT however, they are unable to obtain authorization due to one is already in place at the [facility] location. Patient states [place] authorization needs to be cancelled.

## 2019-11-29 NOTE — Telephone Encounter (Signed)
Called and unable to get through.

## 2019-11-29 NOTE — Telephone Encounter (Signed)
Luke Vasquez  [id number]  Claim# [id number]  Called in reference to patient's last visit . Per [employer], patient has a claim with their office and they need more information in order to process.   Please Advise

## 2019-12-02 ENCOUNTER — Telehealth: Payer: Self-pay | Admitting: Family Medicine

## 2019-12-02 NOTE — Telephone Encounter (Signed)
He needs extension until the 31st surgery is on the 31st

## 2019-12-02 NOTE — Telephone Encounter (Signed)
Pt called back. Please call him back

## 2019-12-02 NOTE — Telephone Encounter (Signed)
Patient informed of PCP instructions. 

## 2019-12-02 NOTE — Telephone Encounter (Signed)
Called and no answer.

## 2019-12-02 NOTE — Telephone Encounter (Signed)
Patient states still not feeling well, patient would like Luke Vasquez to advise him.

## 2019-12-02 NOTE — Telephone Encounter (Signed)
Sudafed can decongest. Steroid nasal sprays. Could consider Depomedrol 80 mg injection to see if that can open things up, sched nurse visit if interested. Ty.

## 2019-12-02 NOTE — Telephone Encounter (Signed)
Called the patient still having congestion/vomiting in the morning/headache/No fever The patient is taking antibiotic and steroids (completed). Symptoms are a little better but still not feeling great. Had no openings tomorrow Advise on other OTC/rx

## 2019-12-03 ENCOUNTER — Encounter: Payer: Self-pay | Admitting: Family Medicine

## 2019-12-03 NOTE — Telephone Encounter (Signed)
OK to do-

## 2019-12-03 NOTE — Telephone Encounter (Signed)
Letter completed///sent through my chart///called informed the patient request is done.

## 2019-12-05 ENCOUNTER — Telehealth: Payer: Self-pay | Admitting: Family Medicine

## 2019-12-05 ENCOUNTER — Other Ambulatory Visit: Payer: 59

## 2019-12-05 NOTE — Telephone Encounter (Signed)
Caller Luke Vasquez Call Back # [phone]   Patient states that he needs work note extended to September 14,2021. Patient states his surgery has been moved to September 14,2021.   Please Advise

## 2019-12-05 NOTE — Telephone Encounter (Signed)
OK 

## 2019-12-06 NOTE — Telephone Encounter (Signed)
Letter sent to mychart -- unable to leave voicemail to make patient aware of note

## 2019-12-09 ENCOUNTER — Ambulatory Visit (INDEPENDENT_AMBULATORY_CARE_PROVIDER_SITE_OTHER): Payer: 59 | Admitting: Otolaryngology

## 2019-12-10 ENCOUNTER — Telehealth: Payer: Self-pay

## 2019-12-10 NOTE — Telephone Encounter (Signed)
Patient called and states [employer] short term disability called him stating they requested more information from our office but has not heard anything back. I do not see a note previously placed. However if you could call [employer] to see what they need. Patient provided phone number [phone] for company.

## 2019-12-10 NOTE — Telephone Encounter (Signed)
Called [employer] and previous form we faxed on 11/27/19 the last page was not clear. So refaxed that form to them. Also, they needed to update the patient most recent OV with PCP and upcoming OV--I gave them that information. They are also in need of OV notes/test/imaging from 11/12/2019 through 12/02/2019. Also they will fax over letter requesting the reason the patient needs to be out of work prior to his surgery.

## 2019-12-10 NOTE — Telephone Encounter (Signed)
Received fax form and faxed all information to them

## 2020-01-17 ENCOUNTER — Other Ambulatory Visit: Payer: Self-pay | Admitting: Family Medicine

## 2020-01-24 ENCOUNTER — Other Ambulatory Visit: Payer: Self-pay | Admitting: Family Medicine

## 2020-02-09 LAB — HM HIV SCREENING LAB: HM HIV Screening: NEGATIVE

## 2020-02-12 ENCOUNTER — Other Ambulatory Visit: Payer: Self-pay | Admitting: Family Medicine

## 2020-02-19 ENCOUNTER — Other Ambulatory Visit: Payer: Self-pay | Admitting: Family Medicine

## 2020-02-19 DIAGNOSIS — R911 Solitary pulmonary nodule: Secondary | ICD-10-CM

## 2020-02-19 DIAGNOSIS — IMO0001 Reserved for inherently not codable concepts without codable children: Secondary | ICD-10-CM

## 2020-02-24 ENCOUNTER — Encounter: Payer: Self-pay | Admitting: Family Medicine

## 2020-02-24 ENCOUNTER — Ambulatory Visit: Payer: 59 | Admitting: Family Medicine

## 2020-02-24 ENCOUNTER — Telehealth: Payer: Self-pay

## 2020-02-24 ENCOUNTER — Other Ambulatory Visit: Payer: Self-pay

## 2020-02-24 VITALS — BP 120/80 | HR 70 | Temp 97.7°F | Ht 70.0 in | Wt 240.0 lb

## 2020-02-24 DIAGNOSIS — R109 Unspecified abdominal pain: Secondary | ICD-10-CM | POA: Diagnosis not present

## 2020-02-24 DIAGNOSIS — R300 Dysuria: Secondary | ICD-10-CM

## 2020-02-24 MED ORDER — HYDROCODONE-ACETAMINOPHEN 5-325 MG PO TABS: 1.0000 | ORAL_TABLET | Freq: Four times a day (QID) | ORAL | 0 refills | Status: DC | PRN

## 2020-02-24 MED ORDER — TAMSULOSIN HCL 0.4 MG PO CAPS: 0.4000 mg | ORAL_CAPSULE | Freq: Every day | ORAL | 0 refills | Status: AC

## 2020-02-24 NOTE — Patient Instructions (Signed)
OK to take Tylenol 1000 mg (2 extra strength tabs) or 975 mg (3 regular strength tabs) every 6 hours as needed.  Let me know if you need more Zofran.   If you spike a high fever (100.4 F or higher), let me know.  Try to drink 55-60 oz of water daily outside of exercise.  Let us know if you need anything.

## 2020-02-24 NOTE — Telephone Encounter (Signed)
PA initiated via Covermymeds; KEY: [id number]. Awaiting determination.

## 2020-02-24 NOTE — Progress Notes (Signed)
Chief Complaint  Patient presents with   Flank Pain   Groin Pain    Subjective: Patient is a 54 y.o. male here for L flank pain.  Started yesterday.  The patient is experiencing left-sided flank pain and discomfort with urination.  No blood in his urine.  He does have a history of kidney stones and feels this is presenting similarly.  He denies any bowel changes, fevers, discharge from the penis, injury, or change in activity.  He has tried ibuprofen to help with little benefit.  He does not drink adequate amounts of water.  No other dietary changes.  Past Medical History:  Diagnosis Date   Depression    GERD (gastroesophageal reflux disease)    History of heart attack 2013   Hypertension    Seasonal allergies     Objective: BP 120/80 (BP Location: Left Arm, Patient Position: Sitting, Cuff Size: Normal)    Pulse 70    Temp 97.7 F (36.5 C) (Oral)    Ht 5\' 10"  (1.778 m)    Wt 240 lb (108.9 kg)    SpO2 96%    BMI 34.44 kg/m  General: Awake, appears stated age Heart: RRR MSK: + CVA tenderness to palpation of the left Lungs: CTAB, no rales, wheezes or rhonchi. No accessory muscle use Abdomen: Soft, bowel sounds present, nondistended, mild tenderness to palpation over the left portion of the abdomen Psych: Age appropriate judgment and insight, normal affect and mood  Assessment and Plan: Dysuria - Plan: tamsulosin (FLOMAX) 0.4 MG CAPS capsule, HYDROcodone-acetaminophen (NORCO) 5-325 MG tablet  Flank pain - Plan: tamsulosin (FLOMAX) 0.4 MG CAPS capsule, HYDROcodone-acetaminophen (NORCO) 5-325 MG tablet  Patient has Zofran at home.  We will empirically treat for kidney stones.  If no improvement, he will return and we will discuss further management/work-up. The patient voiced understanding and agreement to the plan.  [place], DO 02/24/20  11:51 AM

## 2020-02-26 NOTE — Telephone Encounter (Signed)
PA approved. Effective 02/25/20 to 03/26/20.

## 2020-03-05 ENCOUNTER — Other Ambulatory Visit: Payer: Self-pay

## 2020-03-05 ENCOUNTER — Encounter: Payer: Self-pay | Admitting: Medical

## 2020-03-05 ENCOUNTER — Ambulatory Visit: Payer: 59 | Admitting: Medical

## 2020-03-05 VITALS — BP 119/80 | HR 63 | Temp 98.1°F | Resp 16 | Ht 70.0 in | Wt 237.0 lb

## 2020-03-05 DIAGNOSIS — T7840XA Allergy, unspecified, initial encounter: Secondary | ICD-10-CM

## 2020-03-05 MED ORDER — HYDROXYZINE HCL 25 MG PO TABS: 25.0000 mg | ORAL_TABLET | Freq: Three times a day (TID) | ORAL | 0 refills | Status: DC | PRN

## 2020-03-05 MED ORDER — METHYLPREDNISOLONE 4 MG PO TABS: ORAL_TABLET | ORAL | 0 refills | Status: DC

## 2020-03-05 MED ORDER — TRIAMCINOLONE ACETONIDE 0.1 % EX CREA: 1.0000 "application " | TOPICAL_CREAM | Freq: Two times a day (BID) | CUTANEOUS | 0 refills | Status: DC

## 2020-03-05 NOTE — Progress Notes (Signed)
   Subjective:    Patient ID: Luke Vasquez, male    DOB: [birth year], 54 y.o.   MRN: [id number]  HPI Pt had very red rash on his pretibial area that was itching severely. Worse on rt side. Pt shows me picture and the area have improved. Pt did use some hydrocortisone cream and states helped with itch. Rash is improving.  Rash came on Sunday. Also had brief mlid rash on abdomen.  No sucpicious exposres to soaps,creams, detergents. Outdoors or animal exposure.    Still moderate to severe itching.    Review of Systems  Constitutional: Negative for chills and fatigue.  Respiratory: Negative for cough, chest tightness, shortness of breath and wheezing.   Cardiovascular: Negative for chest pain and palpitations.  Gastrointestinal: Negative for abdominal pain.  Musculoskeletal: Negative for back pain.  Skin: Positive for rash.  Neurological: Negative for dizziness and headaches.  Hematological: Negative for adenopathy. Does not bruise/bleed easily.  Psychiatric/Behavioral: Negative for behavioral problems and confusion.       Objective:   Physical Exam  General- No acute distress. Pleasant patient. Neck- Full range of motion, no jvd Lungs- Clear, even and unlabored. Heart- regular rate and rhythm. Neurologic- CNII- XII grossly intact. Skin- rash bilateral lower ext. Rt side pretibial moderate rash(much better than picture). Dry skin. Left side looks almost clear compared to picture. Abdomen skin clear presently.     Assessment & Plan:  I do think you have probable allergic type reaction based on itching symptoms and seems to have responded to hydrocortisone. Cause of probable allergic reaction undetermined.  Will rx low dose taper medrol for 4 days, triamcinolone cream and hydroxyzine. Rx advisement given.   Also keep skin moisturized.  If rash persists or reoccurs then would likely refer to dermatologist.  Follow up in 7-10 days if needed.(also my chart update would be  adequate if resolve completely)  Time spent with patient today was 20  minutes which consisted of chart review, discussing diagnosis, work up treatment and documentation.

## 2020-03-05 NOTE — Patient Instructions (Addendum)
I do think you have probable allergic type reaction based on itching symptoms and seems to have responded to hydrocortisone. Cause of probable allergic reaction undetermined.  Will rx low dose taper medrol for 4 days, triamcinolone cream and hydroxyzine. Rx advisement given.   Also keep skin moisturized.  If rash persists or reoccurs then would likely refer to dermatologist.  Follow up in 7-10 days if needed.(also my chart update would be adequate if resolve completely)

## 2020-05-12 ENCOUNTER — Encounter: Payer: Self-pay | Admitting: Family Medicine

## 2020-05-12 ENCOUNTER — Other Ambulatory Visit: Payer: Self-pay

## 2020-05-12 ENCOUNTER — Telehealth (INDEPENDENT_AMBULATORY_CARE_PROVIDER_SITE_OTHER): Payer: 59 | Admitting: Family Medicine

## 2020-05-12 DIAGNOSIS — U071 COVID-19: Secondary | ICD-10-CM | POA: Diagnosis not present

## 2020-05-12 MED ORDER — PREDNISONE 20 MG PO TABS: 40.0000 mg | ORAL_TABLET | Freq: Every day | ORAL | 0 refills | Status: AC

## 2020-05-12 MED ORDER — BENZONATATE 100 MG PO CAPS: 100.0000 mg | ORAL_CAPSULE | Freq: Three times a day (TID) | ORAL | 0 refills | Status: DC | PRN

## 2020-05-12 NOTE — Progress Notes (Signed)
Chief Complaint  Patient presents with  . Cough  . Covid Positive    Tested positive for Covid on 04/28/20    Luke Vasquez here for URI complaints. Due to COVID-19 pandemic, we are interacting via web portal for an electronic face-to-face visit. I verified patient's ID using 2 identifiers. Patient agreed to proceed with visit via this method. Patient is at home, I am at office. Patient and I are present for visit.   Duration: 20 days  Associated symptoms: rhinorrhea, wheezing, chest tightness, slight diarrhea, and coughing Denies: sinus congestion, sinus pain, itchy watery eyes, ear pain, ear drainage, sore throat, shortness of breath, myalgia and fevers, N/V Treatment to date: cough syrup Sick contacts: Yes - wife had it Tested + for covid on 04/28/20  Past Medical History:  Diagnosis Date  . Depression   . GERD (gastroesophageal reflux disease)   . History of heart attack 2013  . Hypertension   . Seasonal allergies    Exam No conversational dyspnea Age appropriate judgment and insight Nml affect and mood  COVID-19 - Plan: predniSONE (DELTASONE) 20 MG tablet, benzonatate (TESSALON) 100 MG capsule  5 d course pred, 40 mg/d. Tessalon Perles prn cough.  Continue to push fluids, practice good hand hygiene, cover mouth when coughing. F/u prn. If starting to experience irreplaceable fluid loss, shaking, or shortness of breath, seek immediate care. Pt voiced understanding and agreement to the plan.  [place], DO 05/12/20 8:52 AM

## 2020-05-27 ENCOUNTER — Other Ambulatory Visit: Payer: Self-pay

## 2020-05-27 ENCOUNTER — Ambulatory Visit (HOSPITAL_BASED_OUTPATIENT_CLINIC_OR_DEPARTMENT_OTHER)
Admission: RE | Admit: 2020-05-27 | Discharge: 2020-05-27 | Disposition: A | Discharge status: Home / Self Care | Payer: 59 | Source: Ambulatory Visit | Attending: Family Medicine | Admitting: Family Medicine

## 2020-05-27 ENCOUNTER — Ambulatory Visit: Payer: 59 | Admitting: Family Medicine

## 2020-05-27 ENCOUNTER — Encounter: Payer: Self-pay | Admitting: Family Medicine

## 2020-05-27 VITALS — BP 126/80 | HR 66 | Temp 97.9°F | Ht 70.0 in | Wt 238.0 lb

## 2020-05-27 DIAGNOSIS — U099 Post covid-19 condition, unspecified: Secondary | ICD-10-CM | POA: Insufficient documentation

## 2020-05-27 DIAGNOSIS — R053 Chronic cough: Secondary | ICD-10-CM | POA: Diagnosis present

## 2020-05-27 MED ORDER — FLOVENT HFA 110 MCG/ACT IN AERO: 2.0000 | INHALATION_SPRAY | Freq: Two times a day (BID) | RESPIRATORY_TRACT | 1 refills | Status: DC

## 2020-05-27 MED ORDER — BENZONATATE 100 MG PO CAPS: 100.0000 mg | ORAL_CAPSULE | Freq: Three times a day (TID) | ORAL | 0 refills | Status: DC | PRN

## 2020-05-27 NOTE — Progress Notes (Signed)
Chief Complaint  Patient presents with  . Wheezing    Subjective: Patient is a 55 y.o. male here for f/u.  Patient was diagnosed with Covid in December.  He is overall recovered from most of his symptoms except he has intermittent wheezing and continued cough.  He is taking [employer] which are helpful but the cough never fully goes away.  He is not having any shortness of breath.  Associated symptoms include stuffy nose and watery eyes.  He is not having any ear pain/drainage, itchy eyes, sinus pain, runny nose, sore throat, body aches, nausea, vomiting, diarrhea.  He has not had any imaging.  Past Medical History:  Diagnosis Date  . Depression   . GERD (gastroesophageal reflux disease)   . History of heart attack 2013  . Hypertension   . Seasonal allergies     Objective: BP 126/80 (BP Location: Left Arm, Patient Position: Sitting, Cuff Size: Normal)   Pulse 66   Temp 97.9 F (36.6 C) (Oral)   Ht 5\' 10"  (1.778 m)   Wt 238 lb (108 kg)   SpO2 97%   BMI 34.15 kg/m  General: Awake, appears stated age HEENT: MMM, no exudates, EOMi, ears negative bilaterally, no sinus tenderness to palpation bilaterally Heart: RRR, no lower extremity edema Lungs: CTAB, no rales, wheezes or rhonchi. No accessory muscle use Psych: Age appropriate judgment and insight, normal affect and mood  Assessment and Plan: COVID-19 long hauler manifesting chronic cough - Plan: benzonatate (TESSALON) 100 MG capsule, fluticasone (FLOVENT HFA) 110 MCG/ACT inhaler, DG Chest 2 View  Check chest x-ray.  Refill Tessalon Perles.  I will order an inhaled corticosteroid.  He was reminded to rinse his mouth out after usage.  Recommended using before he brushes his teeth, hopefully which is twice a day.  I will see him in 1 month if no improvement. The patient voiced understanding and agreement to the plan.  [place], DO 05/27/20  10:10 AM

## 2020-05-27 NOTE — Patient Instructions (Signed)
We will be in touch regarding your chest X-ray.  Rinse your mouth out after using the inhaler. Consider using it before brushing your teeth.  Cancel your appointment with me if you are doing better.   Let us know if you need anything.

## 2020-07-02 ENCOUNTER — Other Ambulatory Visit: Payer: Self-pay | Admitting: Family Medicine

## 2020-07-13 ENCOUNTER — Other Ambulatory Visit: Payer: Self-pay | Admitting: Family Medicine

## 2020-07-24 ENCOUNTER — Other Ambulatory Visit: Payer: Self-pay | Admitting: Family Medicine

## 2020-08-18 ENCOUNTER — Other Ambulatory Visit: Payer: Self-pay | Admitting: Family Medicine

## 2020-08-21 ENCOUNTER — Ambulatory Visit: Payer: 59 | Admitting: Family Medicine

## 2020-08-22 ENCOUNTER — Other Ambulatory Visit: Payer: Self-pay | Admitting: Family Medicine

## 2020-08-22 DIAGNOSIS — U099 Post covid-19 condition, unspecified: Secondary | ICD-10-CM

## 2020-08-22 DIAGNOSIS — R053 Chronic cough: Secondary | ICD-10-CM

## 2020-09-06 ENCOUNTER — Other Ambulatory Visit: Payer: Self-pay | Admitting: Family Medicine

## 2020-09-07 ENCOUNTER — Other Ambulatory Visit: Payer: Self-pay | Admitting: Family Medicine

## 2020-09-22 ENCOUNTER — Ambulatory Visit: Payer: 59 | Admitting: Family Medicine

## 2020-09-22 ENCOUNTER — Encounter: Payer: Self-pay | Admitting: Family Medicine

## 2020-09-22 ENCOUNTER — Other Ambulatory Visit: Payer: Self-pay

## 2020-09-22 VITALS — BP 110/60 | HR 70 | Temp 97.9°F | Ht 70.0 in | Wt 220.5 lb

## 2020-09-22 DIAGNOSIS — R110 Nausea: Secondary | ICD-10-CM

## 2020-09-22 DIAGNOSIS — R197 Diarrhea, unspecified: Secondary | ICD-10-CM | POA: Diagnosis not present

## 2020-09-22 DIAGNOSIS — J069 Acute upper respiratory infection, unspecified: Secondary | ICD-10-CM | POA: Diagnosis not present

## 2020-09-22 MED ORDER — BENZONATATE 100 MG PO CAPS: 100.0000 mg | ORAL_CAPSULE | Freq: Three times a day (TID) | ORAL | 0 refills | Status: DC | PRN

## 2020-09-22 MED ORDER — ONDANSETRON 4 MG PO TBDP: 4.0000 mg | ORAL_TABLET | Freq: Three times a day (TID) | ORAL | 0 refills | Status: DC | PRN

## 2020-09-22 MED ORDER — METHYLPREDNISOLONE ACETATE 80 MG/ML IJ SUSP
80.0000 mg | Freq: Once | INTRAMUSCULAR | Status: AC
  Administered 2020-09-22: 80 mg via INTRAMUSCULAR

## 2020-09-22 MED ORDER — PROMETHAZINE HCL 25 MG PO TABS: 25.0000 mg | ORAL_TABLET | Freq: Four times a day (QID) | ORAL | 1 refills | Status: DC | PRN

## 2020-09-22 NOTE — Progress Notes (Signed)
Chief Complaint  Patient presents with  . Abdominal Pain  . Diarrhea    Congestion     Marene Lenz here for URI complaints.  Duration: 1 day  Associated symptoms: sinus headache, sinus congestion, chest tightness and coughing, nausea, diarrhea Denies: rhinorrhea, itchy watery eyes, ear pain, ear drainage, sore throat, wheezing, shortness of breath, myalgia and fevers, vomiting Treatment to date: Mucinex Sick contacts: No  Tested for covid this AM, waiting on results.   Past Medical History:  Diagnosis Date  . Depression   . GERD (gastroesophageal reflux disease)   . History of heart attack 2013  . Hypertension   . Seasonal allergies     BP 110/60 (BP Location: Left Arm, Patient Position: Sitting, Cuff Size: Normal)   Pulse 70   Temp 97.9 F (36.6 C) (Oral)   Ht 5\' 10"  (1.778 m)   Wt 220 lb 8 oz (100 kg)   SpO2 97%   BMI 31.64 kg/m  General: Awake, alert, appears stated age HEENT: AT, [place], ears patent b/l and TM's neg, nares patent w/o discharge, pharynx pink and without exudates, MMM Neck: No masses or asymmetry Heart: RRR Abd: BS+, S, NT, ND Lungs: CTAB, no accessory muscle use Psych: Age appropriate judgment and insight, normal mood and affect  Viral URI with cough - Plan: benzonatate (TESSALON) 100 MG capsule  Nausea - Plan: ondansetron (ZOFRAN-ODT) 4 MG disintegrating tablet  Diarrhea, unspecified type  Interested to see covid results. I think this needs to run its course.  Continue to push fluids, practice good hand hygiene, cover mouth when coughing. F/u prn. If starting to experience fevers, shaking, or shortness of breath, seek immediate care. Pt voiced understanding and agreement to the plan.  [place], DO 09/22/20 3:18 PM

## 2020-09-22 NOTE — Addendum Note (Signed)
Addended by: Sharon Seller B on: 09/22/2020 03:27 PM   Modules accepted: Orders

## 2020-09-22 NOTE — Patient Instructions (Signed)
OK to take Tylenol 1000 mg (2 extra strength tabs) or 975 mg (3 regular strength tabs) every 6 hours as needed.  Continue to push fluids, practice good hand hygiene, and cover your mouth if you cough.  If you start having fevers, shaking or shortness of breath, seek immediate care.  Let us know if you need anything.

## 2020-09-28 ENCOUNTER — Encounter: Payer: Self-pay | Admitting: Family Medicine

## 2020-09-28 ENCOUNTER — Other Ambulatory Visit: Payer: Self-pay | Admitting: Family Medicine

## 2020-09-28 MED ORDER — AZITHROMYCIN 250 MG PO TABS: ORAL_TABLET | ORAL | 0 refills | Status: DC

## 2020-10-01 ENCOUNTER — Other Ambulatory Visit: Payer: Self-pay | Admitting: Family Medicine

## 2020-10-06 ENCOUNTER — Ambulatory Visit: Payer: 59 | Admitting: Family Medicine

## 2020-11-04 ENCOUNTER — Ambulatory Visit: Payer: 59 | Admitting: Family Medicine

## 2020-11-04 ENCOUNTER — Other Ambulatory Visit: Payer: Self-pay

## 2020-11-04 ENCOUNTER — Encounter: Payer: Self-pay | Admitting: Family Medicine

## 2020-11-04 VITALS — BP 112/78 | HR 68 | Temp 97.9°F | Ht 70.0 in | Wt 222.5 lb

## 2020-11-04 DIAGNOSIS — K859 Acute pancreatitis without necrosis or infection, unspecified: Secondary | ICD-10-CM

## 2020-11-04 DIAGNOSIS — N281 Cyst of kidney, acquired: Secondary | ICD-10-CM | POA: Diagnosis not present

## 2020-11-04 NOTE — Patient Instructions (Addendum)
[phone] to schedule your CT chest, might be worth while waiting to schedule your ultrasounds.  Drink responsibly.  Your kidney function looks great on paper, I don't think this is something you'll need to worry about.  Let us know if you need anything.

## 2020-11-04 NOTE — Progress Notes (Addendum)
CC: Follow up MRI  Subjective: Patient is a 55 y.o. male here for f/u.  Patient had an MRI done in an outside hospital on 7/7 that showed multiple cysts on both kidneys.  His renal function is normal.  No family history of any cysts on the kidneys.  No known history of polycystic kidney disease.  He is urinating normally.  The patient has a history of pancreatitis.  He recently had his fourth bout of it.  He drinks 2, sometimes 3 times per week, but nothing excessive.  He does not think alcohol is contributing to this.  He reports never having had an ultrasound of his gallbladder before.  He is not known to have gallstones.  He does have a some diffuse belly pain today.  No nausea, vomiting, or diarrhea.  Past Medical History:  Diagnosis Date   Depression    GERD (gastroesophageal reflux disease)    History of heart attack 2013   Hypertension    Seasonal allergies     Objective: BP 112/78   Pulse 68   Temp 97.9 F (36.6 C) (Oral)   Ht 5\' 10"  (1.778 m)   Wt 222 lb 8 oz (100.9 kg)   SpO2 97%   BMI 31.93 kg/m  General: Awake, appears stated age Abdomen: Bowel sounds present, soft, mild tenderness to palpation diffusely, no specific right upper quadrant abdominal pain, negative Murphy's, McBurney's, Rovsing's, [place] Heart: RRR Lungs: CTAB, no rales, wheezes or rhonchi. No accessory muscle use Psych: Age appropriate judgment and insight, normal affect and mood  Assessment and Plan: Recurrent pancreatitis - Plan: US ABDOMEN LIMITED RUQ (LIVER/GB)  Kidney cysts - Plan: [employer] [facility] [employer]  Chronic/recurrent issue, uncertain prognosis.  Check ultrasound gallbladder to ensure no stones or inflammation.  If the former, will refer to general surgery.  This could explain why he has had pancreatitis 4 times in the setting without significant alcohol use. We will follow-up with an ultrasound of the kidneys to further characterize his cysts.  Reassurance given overall as his renal  function is normal. The patient voiced understanding and agreement to the plan.  [place], DO 11/04/20  1:42 PM

## 2020-11-30 ENCOUNTER — Telehealth: Payer: Self-pay | Admitting: Family Medicine

## 2020-12-01 ENCOUNTER — Telehealth (INDEPENDENT_AMBULATORY_CARE_PROVIDER_SITE_OTHER): Payer: 59 | Admitting: Internal Medicine

## 2020-12-01 ENCOUNTER — Telehealth: Payer: Self-pay

## 2020-12-01 ENCOUNTER — Other Ambulatory Visit: Payer: Self-pay

## 2020-12-01 ENCOUNTER — Encounter: Payer: Self-pay | Admitting: Family Medicine

## 2020-12-01 ENCOUNTER — Encounter: Payer: Self-pay | Admitting: Internal Medicine

## 2020-12-01 VITALS — Ht 70.0 in | Wt 220.0 lb

## 2020-12-01 DIAGNOSIS — U071 COVID-19: Secondary | ICD-10-CM | POA: Diagnosis not present

## 2020-12-01 NOTE — Progress Notes (Signed)
Subjective:    Patient ID: Luke Vasquez, male    DOB: [birth year], 55 y.o.   MRN: [id number]  DOS:  12/01/2020 Type of visit - description: Virtual Visit via Telephone    I connected with above mentioned patient  by telephone and verified that I am speaking with the correct person using two identifiers.  THIS ENCOUNTER IS A VIRTUAL VISIT DUE TO COVID-19 - PATIENT WAS NOT SEEN IN THE OFFICE. PATIENT HAS CONSENTED TO VIRTUAL VISIT / TELEMEDICINE VISIT   Location of patient: home  Location of provider: office  Persons participating in the virtual visit: patient, provider   I discussed the limitations, risks, security and privacy concerns of performing an evaluation and management service by telephone and the availability of in person appointments. I also discussed with the patient that there may be a patient responsible charge related to this service. The patient expressed understanding and agreed to proceed.    Acute Symptoms a started 2 days ago: Fever as high as 102.8. Nausea, sore throat, runny nose. Also having cough with yellowish sputum.  No hemoptysis. Wife was just diagnosed with COVID.  He denies chest pain or difficulty breathing ++ fatigue Myalgias- no No lower extremity edema No BP is or O2 sat available  Review of Systems See above   Past Medical History:  Diagnosis Date   Depression    GERD (gastroesophageal reflux disease)    History of heart attack 2013   Hypertension    Seasonal allergies     Past Surgical History:  Procedure Laterality Date   CORONARY STENT PLACEMENT  2013   LITHOTRIPSY      Allergies as of 12/01/2020       Reactions   Regadenoson Other (See Comments)   Other reaction(s): Hypotension, Other (See Comments) Other reaction(s): Hypotension, Other (See Comments), Other (see comments) Other reaction(s): Hypotension, Other (See Comments)        Medication List        Accurate as of December 01, 2020 11:59 PM. If you have  any questions, ask your nurse or doctor.          STOP taking these medications    azelastine 0.1 % nasal spray Commonly known as: ASTELIN Stopped by: Kathlene November, MD   azithromycin 250 MG tablet Commonly known as: ZITHROMAX Stopped by: Kathlene November, MD   benzonatate 100 MG capsule Commonly known as: TESSALON Stopped by: Kathlene November, MD   fluticasone 50 MCG/ACT nasal spray Commonly known as: FLONASE Stopped by: Kathlene November, MD   triamcinolone cream 0.1 % Commonly known as: KENALOG Stopped by: Kathlene November, MD       TAKE these medications    aspirin EC 81 MG tablet Take 81 mg by mouth daily.   Brilinta 60 MG Tabs tablet Generic drug: ticagrelor TAKE 1 TABLET BY MOUTH TWICE A DAY   cetirizine 10 MG tablet Commonly known as: ZYRTEC Take 1 tablet (10 mg total) by mouth daily.   famotidine 20 MG tablet Commonly known as: PEPCID Take 20 mg by mouth 2 (two) times daily.   Flovent HFA 110 MCG/ACT inhaler Generic drug: fluticasone TAKE 2 PUFFS BY MOUTH TWICE A DAY IN THE MORNING & AT BEDTIME. RINSE MOUTH OUT AFTER USE   icosapent Ethyl 1 g capsule Commonly known as: Vascepa Take 2 capsules (2 g total) by mouth 2 (two) times daily.   lisinopril 10 MG tablet Commonly known as: ZESTRIL TAKE 1 TABLET BY MOUTH EVERY DAY *INSURANCE REQUIRES  90 DAY SUPPLY   metoprolol tartrate 25 MG tablet Commonly known as: LOPRESSOR TAKE 1/2 TABLET BY MOUTH TWICE DAILY   montelukast 10 MG tablet Commonly known as: SINGULAIR TAKE 1 TABLET BY MOUTH EVERYDAY AT BEDTIME   omeprazole 20 MG capsule Commonly known as: PRILOSEC Take 1 capsule (20 mg total) by mouth 2 (two) times daily before a meal.   promethazine 25 MG tablet Commonly known as: PHENERGAN Take 1 tablet (25 mg total) by mouth every 6 (six) hours as needed for nausea or vomiting.   ranolazine 500 MG 12 hr tablet Commonly known as: RANEXA TAKE 1 TABLET BY MOUTH TWICE A DAY   rosuvastatin 20 MG tablet Commonly known as:  CRESTOR Take 1 tablet by mouth once daily   traZODone 50 MG tablet Commonly known as: DESYREL Take 0.5-1 tablets (25-50 mg total) by mouth at bedtime as needed for sleep.           Objective:   Physical Exam Ht '5\' 10"'$  (1.778 m)   Wt 220 lb (99.8 kg)   BMI 31.57 kg/m  This was a telephone evaluation, he was not able to log into the video.  He did not sound confused, or toxic.  Speaking in complete sentences, some cough noted, some nasal congestion noted.  No BP or O2 sats available.    Assessment     55 year old male, PMH includes hypertension, CAD, no previous COVID vaccines, presents with:    Febrile illness: The patient has a febrile illness for the last 2 days, temperature has been as high or 102.8.  He also has respiratory symptoms, wife has similar symptoms. COVID is suspected, he was never immunized, a PCR test result should be ready this afternoon. Plan @ 3 PM: -- wait for PCR test, if not available today geta rapid home test on call for [facility] in the morning. --Rest, fluids, Tylenol, Robitussin-DM --ER if severe symptoms Addendum Home test came back positive, patient referred for the monoclonal antibody infusion. Otherwise plan is the same. Will let PCP know.   I discussed the assessment and treatment plan with the patient. The patient was provided an opportunity to ask questions and all were answered. The patient agreed with the plan and demonstrated an understanding of the instructions.   The patient was advised to call back or seek an in-person evaluation if the symptoms worsen or if the condition fails to improve as anticipated.  I provided 25 minutes of non-face-to-face time during this encounter.  Kathlene November, MD

## 2020-12-01 NOTE — Telephone Encounter (Signed)
Called to schedule Bebtelovimab infusion. No answer. Left message for patient to call back. Thanks!

## 2020-12-02 ENCOUNTER — Encounter: Payer: Self-pay | Admitting: Internal Medicine

## 2020-12-02 ENCOUNTER — Telehealth: Payer: Self-pay | Admitting: Internal Medicine

## 2020-12-02 MED ORDER — MOLNUPIRAVIR EUA 200MG CAPSULE: 4.0000 | ORAL_CAPSULE | Freq: Two times a day (BID) | ORAL | 0 refills | Status: AC

## 2020-12-02 MED ORDER — NIRMATRELVIR/RITONAVIR (PAXLOVID)TABLET: 3.0000 | ORAL_TABLET | Freq: Two times a day (BID) | ORAL | 0 refills | Status: DC

## 2020-12-02 NOTE — Telephone Encounter (Signed)
Patient electing orals. He is on Brilinta, Ranexa, rosuvastatin.  All of them will have to be hold if I prescribe Paxlovid thus will prescribe Molnupiravir, send prescription Cancel infusion  Advised patient about above If symptoms increase strongly recommend to go to the ER Once he is better, I will asked him to reconsider COVID vaccination Follow-up with PCP to 3 weeks CC: PCP

## 2020-12-02 NOTE — Telephone Encounter (Signed)
Molnupiravir sent.

## 2020-12-23 ENCOUNTER — Encounter: Payer: Self-pay | Admitting: Internal Medicine

## 2020-12-24 ENCOUNTER — Telehealth: Payer: Self-pay | Admitting: Family Medicine

## 2020-12-24 NOTE — Telephone Encounter (Signed)
Pt seen by Dr. Larose Kells for Covid. Will have Paz sign paperwork.

## 2020-12-24 NOTE — Telephone Encounter (Signed)
Document faxed in to office for provider to fill out (Short Term Disability 8 pages) Document put at front office tray under providers name.

## 2020-12-24 NOTE — Telephone Encounter (Signed)
Form completed, in Dr. Ethel Rana red folder to be signed.

## 2020-12-25 DIAGNOSIS — Z0279 Encounter for issue of other medical certificate: Secondary | ICD-10-CM

## 2020-12-25 NOTE — Telephone Encounter (Signed)
Completed form faxed back to [employer] [phone]. Form sent for scanning.

## 2020-12-25 NOTE — Telephone Encounter (Signed)
Received fax confirmation

## 2020-12-29 ENCOUNTER — Encounter: Payer: Self-pay | Admitting: Internal Medicine

## 2021-01-08 ENCOUNTER — Other Ambulatory Visit: Payer: Self-pay | Admitting: Family Medicine

## 2021-01-08 DIAGNOSIS — R1013 Epigastric pain: Secondary | ICD-10-CM

## 2021-01-15 ENCOUNTER — Telehealth: Payer: Self-pay | Admitting: Family Medicine

## 2021-01-15 NOTE — Telephone Encounter (Signed)
Patient called regarding Gallbladder scan. Patient stated Nani Ravens inform him last visit 11/04/2020 that this particular scan was needed. Advised patient to schedule CT chest scan in the meantime per avs notes, provided number and transferred patient. Please advise.

## 2021-01-25 ENCOUNTER — Telehealth: Payer: 59 | Admitting: Physician Assistant

## 2021-01-25 ENCOUNTER — Other Ambulatory Visit: Payer: Self-pay | Admitting: Family Medicine

## 2021-01-25 DIAGNOSIS — R1013 Epigastric pain: Secondary | ICD-10-CM

## 2021-01-25 DIAGNOSIS — K859 Acute pancreatitis without necrosis or infection, unspecified: Secondary | ICD-10-CM

## 2021-01-25 NOTE — Telephone Encounter (Signed)
Patient has been made aware.

## 2021-01-25 NOTE — Telephone Encounter (Signed)
Not sure what happened with the previous order but I placed another one. Ty.

## 2021-01-25 NOTE — Progress Notes (Signed)
Virtual Visit Consent   Luke Vasquez, you are scheduled for a virtual visit with a [facility] provider today.     Just as with appointments in the office, your consent must be obtained to participate.  Your consent will be active for this visit and any virtual visit you may have with one of our providers in the next 365 days.     If you have a MyChart account, a copy of this consent can be sent to you electronically.  All virtual visits are billed to your insurance company just like a traditional visit in the office.    As this is a virtual visit, video technology does not allow for your provider to perform a traditional examination.  This may limit your provider's ability to fully assess your condition.  If your provider identifies any concerns that need to be evaluated in person or the need to arrange testing (such as labs, EKG, etc.), we will make arrangements to do so.     Although advances in technology are sophisticated, we cannot ensure that it will always work on either your end or our end.  If the connection with a video visit is poor, the visit may have to be switched to a telephone visit.  With either a video or telephone visit, we are not always able to ensure that we have a secure connection.     I need to obtain your verbal consent now.   Are you willing to proceed with your visit today?    Luke Vasquez has provided verbal consent on 01/25/2021 for a virtual visit (video or telephone).   Mar Daring, PA-C   Date: 01/25/2021 2:10 PM   Virtual Visit via Video Note   I, Mar Daring, connected with  Luke Vasquez  ([id number], 01-19-1966) on 01/25/21 at  2:00 PM EDT by a video-enabled telemedicine application and verified that I am speaking with the correct person using two identifiers.  Location: Patient: Virtual Visit Location Patient: Home Provider: Virtual Visit Location Provider: Home Office   I discussed the limitations of evaluation and  management by telemedicine and the availability of in person appointments. The patient expressed understanding and agreed to proceed.    History of Present Illness: Luke Vasquez is a 55 y.o. who identifies as a male who was assigned male at birth, and is being seen today for possible pancreatitis. Reports he has multiple bouts of pancreatitis recently. Feels he is having another today. Having severe epigastric pain that radiates through to the back and in between the shoulder blades. Has been trying OTC tylenol and ibuprofen without relief.   Problems:  Patient Active Problem List   Diagnosis Date Noted   Gastroesophageal reflux disease 10/27/2018   Change in bowel habits 10/27/2018   Intermittent diarrhea 10/27/2018   Suprapubic abdominal pain 10/27/2018   Diarrhea 03/20/2018    Allergies:  Allergies  Allergen Reactions   Regadenoson Other (See Comments)    Other reaction(s): Hypotension, Other (See Comments) Other reaction(s): Hypotension, Other (See Comments), Other (see comments) Other reaction(s): Hypotension, Other (See Comments)    Medications:  Current Outpatient Medications:    aspirin EC 81 MG tablet, Take 81 mg by mouth daily., Disp: , Rfl:    BRILINTA 60 MG TABS tablet, TAKE 1 TABLET BY MOUTH TWICE A DAY, Disp: 60 tablet, Rfl: 2   cetirizine (ZYRTEC) 10 MG tablet, Take 1 tablet (10 mg total) by mouth daily., Disp: 30 tablet, Rfl: 2  famotidine (PEPCID) 20 MG tablet, Take 20 mg by mouth 2 (two) times daily. (Patient not taking: Reported on 12/01/2020), Disp: , Rfl: 0   FLOVENT HFA 110 MCG/ACT inhaler, TAKE 2 PUFFS BY MOUTH TWICE A DAY IN THE MORNING & AT BEDTIME. RINSE MOUTH OUT AFTER USE (Patient not taking: Reported on 12/01/2020), Disp: 12 each, Rfl: 1   icosapent Ethyl (VASCEPA) 1 g capsule, Take 2 capsules (2 g total) by mouth 2 (two) times daily., Disp: 120 capsule, Rfl: 3   lisinopril (ZESTRIL) 10 MG tablet, TAKE 1 TABLET BY MOUTH EVERY DAY *INSURANCE REQUIRES 90  DAY SUPPLY, Disp: 90 tablet, Rfl: 1   metoprolol tartrate (LOPRESSOR) 25 MG tablet, TAKE 1/2 TABLET BY MOUTH TWICE DAILY, Disp: 90 tablet, Rfl: 1   montelukast (SINGULAIR) 10 MG tablet, TAKE 1 TABLET BY MOUTH EVERYDAY AT BEDTIME, Disp: 90 tablet, Rfl: 1   omeprazole (PRILOSEC) 20 MG capsule, TAKE 1 CAPSULE BY MOUTH TWICE A DAY BEFORE MEALS, Disp: 180 capsule, Rfl: 3   promethazine (PHENERGAN) 25 MG tablet, Take 1 tablet (25 mg total) by mouth every 6 (six) hours as needed for nausea or vomiting. (Patient not taking: Reported on 12/01/2020), Disp: 30 tablet, Rfl: 1   ranolazine (RANEXA) 500 MG 12 hr tablet, TAKE 1 TABLET BY MOUTH TWICE A DAY, Disp: 180 tablet, Rfl: 4   rosuvastatin (CRESTOR) 20 MG tablet, Take 1 tablet by mouth once daily, Disp: 30 tablet, Rfl: 0   traZODone (DESYREL) 50 MG tablet, Take 0.5-1 tablets (25-50 mg total) by mouth at bedtime as needed for sleep. (Patient not taking: Reported on 12/01/2020), Disp: 30 tablet, Rfl: 3  Observations/Objective: Patient is well-developed, well-nourished in no acute distress.  Resting comfortably at home.  Head is normocephalic, atraumatic.  No labored breathing.  Speech is clear and coherent with logical content.  Patient is alert and oriented at baseline.    Assessment and Plan: 1. Epigastric pain  - Suspect recurrent pancreatitis - Advised to proceed to ER for further evaluation and pain management - He voiced understanding and agrees to proceed via private vehicle  Follow Up Instructions: I discussed the assessment and treatment plan with the patient. The patient was provided an opportunity to ask questions and all were answered. The patient agreed with the plan and demonstrated an understanding of the instructions.  A copy of instructions were sent to the patient via MyChart unless otherwise noted below.   The patient was advised to call back or seek an in-person evaluation if the symptoms worsen or if the condition fails to improve  as anticipated.  Time:  I spent 6 minutes with the patient via telehealth technology discussing the above problems/concerns.    Mar Daring, PA-C

## 2021-01-25 NOTE — Telephone Encounter (Signed)
Called left message to call back 

## 2021-01-25 NOTE — Patient Instructions (Signed)
Marene Lenz, thank you for joining Mar Daring, PA-C for today's virtual visit.  While this provider is not your primary care provider (PCP), if your PCP is located in our provider database this encounter information will be shared with them immediately following your visit.  Consent: (Patient) Marene Lenz provided verbal consent for this virtual visit at the beginning of the encounter.  Current Medications:  Current Outpatient Medications:    aspirin EC 81 MG tablet, Take 81 mg by mouth daily., Disp: , Rfl:    BRILINTA 60 MG TABS tablet, TAKE 1 TABLET BY MOUTH TWICE A DAY, Disp: 60 tablet, Rfl: 2   cetirizine (ZYRTEC) 10 MG tablet, Take 1 tablet (10 mg total) by mouth daily., Disp: 30 tablet, Rfl: 2   famotidine (PEPCID) 20 MG tablet, Take 20 mg by mouth 2 (two) times daily. (Patient not taking: Reported on 12/01/2020), Disp: , Rfl: 0   FLOVENT HFA 110 MCG/ACT inhaler, TAKE 2 PUFFS BY MOUTH TWICE A DAY IN THE MORNING & AT BEDTIME. RINSE MOUTH OUT AFTER USE (Patient not taking: Reported on 12/01/2020), Disp: 12 each, Rfl: 1   icosapent Ethyl (VASCEPA) 1 g capsule, Take 2 capsules (2 g total) by mouth 2 (two) times daily., Disp: 120 capsule, Rfl: 3   lisinopril (ZESTRIL) 10 MG tablet, TAKE 1 TABLET BY MOUTH EVERY DAY *INSURANCE REQUIRES 90 DAY SUPPLY, Disp: 90 tablet, Rfl: 1   metoprolol tartrate (LOPRESSOR) 25 MG tablet, TAKE 1/2 TABLET BY MOUTH TWICE DAILY, Disp: 90 tablet, Rfl: 1   montelukast (SINGULAIR) 10 MG tablet, TAKE 1 TABLET BY MOUTH EVERYDAY AT BEDTIME, Disp: 90 tablet, Rfl: 1   omeprazole (PRILOSEC) 20 MG capsule, TAKE 1 CAPSULE BY MOUTH TWICE A DAY BEFORE MEALS, Disp: 180 capsule, Rfl: 3   promethazine (PHENERGAN) 25 MG tablet, Take 1 tablet (25 mg total) by mouth every 6 (six) hours as needed for nausea or vomiting. (Patient not taking: Reported on 12/01/2020), Disp: 30 tablet, Rfl: 1   ranolazine (RANEXA) 500 MG 12 hr tablet, TAKE 1 TABLET BY MOUTH TWICE A DAY,  Disp: 180 tablet, Rfl: 4   rosuvastatin (CRESTOR) 20 MG tablet, Take 1 tablet by mouth once daily, Disp: 30 tablet, Rfl: 0   traZODone (DESYREL) 50 MG tablet, Take 0.5-1 tablets (25-50 mg total) by mouth at bedtime as needed for sleep. (Patient not taking: Reported on 12/01/2020), Disp: 30 tablet, Rfl: 3   Medications ordered in this encounter:  No orders of the defined types were placed in this encounter.    *If you need refills on other medications prior to your next appointment, please contact your pharmacy*  Follow-Up: Call back or seek an in-person evaluation if the symptoms worsen or if the condition fails to improve as anticipated.  Other Instructions Acute Pancreatitis The pancreas is a gland that is located behind the stomach on the left side of the abdomen. It produces enzymes that help to digest food. The pancreas also releases the hormones glucagon and insulin, which help to regulate blood sugar. Acute pancreatitis happens when inflammation of the pancreas suddenly occurs and the pancreas becomes irritated and swollen. Most acute attacks last a few days and cause serious problems. Some people become dehydrated and develop low blood pressure. In severe cases, bleeding in the abdomen can lead to shock and can be life-threatening. The lungs, heart, and kidneys may fail. What are the causes? This condition may be caused by: Alcohol abuse. Drug abuse. Gallstones or other conditions that  can block the tube that drains the pancreas (pancreatic duct). A tumor in the pancreas. Other causes include: Certain medicines. Exposure to certain chemicals. Diabetes. An infection in the pancreas. Damage caused by an accident (trauma). The poison (venom) from a scorpion bite. Abdominal surgery. Autoimmune pancreatitis. This is when the body's disease-fighting (immune) system attacks the pancreas. Genes that are passed from parent to child (inherited). In some cases, the cause of this  condition is not known. What are the signs or symptoms? Symptoms of this condition include: Pain in the upper abdomen that may radiate to the back. Pain may be severe. Tenderness and swelling of the abdomen. Nausea and vomiting. Fever. How is this diagnosed? This condition may be diagnosed based on: A physical exam. Blood tests. Imaging tests, such as X-rays, CT or MRI scans, or an ultrasound of the abdomen. How is this treated? Treatment for this condition usually requires a stay in the hospital. Treatment for this condition may include: Pain medicine. Fluid replacement through an IV. Placing a tube in the stomach to remove stomach contents and to control vomiting (NG tube, or nasogastric tube). Not eating for 3-4 days. This gives the pancreas a rest, because enzymes are not being produced that can cause further damage. Antibiotic medicines, if your condition is caused by an infection. Treating any underlying conditions that may be the cause. Steroid medicines, if your condition is caused by your immune system attacking your body's own tissues (autoimmune disease). Surgery on the pancreas or gallbladder. Follow these instructions at home: Eating and drinking  Follow instructions from your health care provider about diet. This may involve avoiding alcohol and decreasing the amount of fat in your diet. Eat smaller, more frequent meals. This reduces the amount of digestive fluids that the pancreas produces. Drink enough fluid to keep your urine pale yellow. Do not drink alcohol if it caused your condition. General instructions Take over-the-counter and prescription medicines only as told by your health care provider. Do not drive or use heavy machinery while taking prescription pain medicine. Ask your health care provider if the medicine prescribed to you can cause constipation. You may need to take steps to prevent or treat constipation, such as: Take an over-the-counter or  prescription medicine for constipation. Eat foods that are high in fiber such as whole grains and beans. Limit foods that are high in fat and processed sugars, such as fried or sweet foods. Do not use any products that contain nicotine or tobacco, such as cigarettes, e-cigarettes, and chewing tobacco. If you need help quitting, ask your health care provider. Get plenty of rest. If directed, check your blood sugar at home as told by your health care provider. Keep all follow-up visits as told by your health care provider. This is important. Contact a health care provider if you: Do not recover as quickly as expected. Develop new or worsening symptoms. Have persistent pain, weakness, or nausea. Recover and then have another episode of pain. Have a fever. Get help right away if: You cannot eat or keep fluids down. Your pain becomes severe. Your skin or the white part of your eyes turns yellow (jaundice). You have sudden swelling in your abdomen. You vomit. You feel dizzy or you faint. Your blood sugar is high (over 300 mg/dL). Summary Acute pancreatitis happens when inflammation of the pancreas suddenly occurs and the pancreas becomes irritated and swollen. This condition is typically caused by alcohol abuse, drug abuse, or gallstones. Treatment for this condition usually requires a  stay in the hospital. This information is not intended to replace advice given to you by your health care provider. Make sure you discuss any questions you have with your health care provider. Document Revised: 01/22/2018 Document Reviewed: 10/09/2017 Elsevier Patient Education  2022 [employer].    If you have been instructed to have an in-person evaluation today at a local Urgent Care facility, please use the link below. It will take you to a list of all of our available [facility], including address, phone number and hours of operation. Please do not delay care.  [facility]  If you or a family member do not have a primary care provider, use the link below to schedule a visit and establish care. When you choose a [facility] primary care physician or advanced practice provider, you gain a long-term partner in health. Find a Primary Care Provider  Learn more about [facility] in-office and virtual care options: [facility]

## 2021-01-26 ENCOUNTER — Encounter: Payer: Self-pay | Admitting: Medical

## 2021-01-26 ENCOUNTER — Other Ambulatory Visit: Payer: Self-pay

## 2021-01-26 ENCOUNTER — Ambulatory Visit: Payer: 59 | Admitting: Medical

## 2021-01-26 ENCOUNTER — Ambulatory Visit (HOSPITAL_BASED_OUTPATIENT_CLINIC_OR_DEPARTMENT_OTHER)
Admission: RE | Admit: 2021-01-26 | Discharge: 2021-01-26 | Disposition: A | Discharge status: Home / Self Care | Payer: 59 | Source: Ambulatory Visit | Attending: Medical | Admitting: Medical

## 2021-01-26 VITALS — BP 139/85 | HR 78 | Temp 97.7°F | Ht 70.0 in | Wt 235.0 lb

## 2021-01-26 DIAGNOSIS — Z23 Encounter for immunization: Secondary | ICD-10-CM | POA: Diagnosis not present

## 2021-01-26 DIAGNOSIS — R1011 Right upper quadrant pain: Secondary | ICD-10-CM

## 2021-01-26 DIAGNOSIS — R1010 Upper abdominal pain, unspecified: Secondary | ICD-10-CM | POA: Diagnosis not present

## 2021-01-26 DIAGNOSIS — R1031 Right lower quadrant pain: Secondary | ICD-10-CM | POA: Diagnosis not present

## 2021-01-26 LAB — CBC WITH DIFFERENTIAL/PLATELET
Basophils Absolute: 0.1 10*3/uL (ref 0.0–0.1)
Basophils Relative: 1 % (ref 0.0–3.0)
Eosinophils Absolute: 0.1 10*3/uL (ref 0.0–0.7)
Eosinophils Relative: 2.3 % (ref 0.0–5.0)
HCT: 47.1 % (ref 39.0–52.0)
Hemoglobin: 16 g/dL (ref 13.0–17.0)
Lymphocytes Relative: 25.6 % (ref 12.0–46.0)
Lymphs Abs: 1.6 10*3/uL (ref 0.7–4.0)
MCHC: 33.9 g/dL (ref 30.0–36.0)
MCV: 96 fl (ref 78.0–100.0)
Monocytes Absolute: 0.7 10*3/uL (ref 0.1–1.0)
Monocytes Relative: 10.9 % (ref 3.0–12.0)
Neutro Abs: 3.8 10*3/uL (ref 1.4–7.7)
Neutrophils Relative %: 60.2 % (ref 43.0–77.0)
Platelets: 167 10*3/uL (ref 150.0–400.0)
RBC: 4.9 Mil/uL (ref 4.22–5.81)
RDW: 13.1 % (ref 11.5–15.5)
WBC: 6.3 10*3/uL (ref 4.0–10.5)

## 2021-01-26 LAB — COMPREHENSIVE METABOLIC PANEL
ALT: 35 U/L (ref 0–53)
AST: 27 U/L (ref 0–37)
Albumin: 4.1 g/dL (ref 3.5–5.2)
Alkaline Phosphatase: 36 U/L — ABNORMAL LOW (ref 39–117)
BUN: 20 mg/dL (ref 6–23)
CO2: 26 mEq/L (ref 19–32)
Calcium: 9.2 mg/dL (ref 8.4–10.5)
Chloride: 107 mEq/L (ref 96–112)
Creatinine, Ser: 1.33 mg/dL (ref 0.40–1.50)
GFR: 60.6 mL/min (ref >60.00)
Glucose, Bld: 109 mg/dL — ABNORMAL HIGH (ref 70–99)
Potassium: 4.5 mEq/L (ref 3.5–5.1)
Sodium: 140 mEq/L (ref 135–145)
Total Bilirubin: 0.7 mg/dL (ref 0.2–1.2)
Total Protein: 6.5 g/dL (ref 6.0–8.3)

## 2021-01-26 LAB — LIPASE: Lipase: 23 U/L (ref 11.0–59.0)

## 2021-01-26 LAB — TROPONIN I (HIGH SENSITIVITY): High Sens Troponin I: 6 ng/L (ref 2–17)

## 2021-01-26 LAB — AMYLASE: Amylase: 20 U/L — ABNORMAL LOW (ref 27–131)

## 2021-01-26 MED ORDER — FAMOTIDINE 20 MG PO TABS: 20.0000 mg | ORAL_TABLET | Freq: Every day | ORAL | 0 refills | Status: DC

## 2021-01-26 NOTE — Patient Instructions (Signed)
History of severe side epigastric and right upper quadrant pain.  Various times in the past pancreatitis and suspect likely going on presently.  We will get stat CBC, CMP, lipase and amylase.  Including 1 set troponin based on your past cardiac history.    Recommend not to eat or drink anything until 5PM.  I did get your ultrasound scheduled earlier.  I will get stat read and see if any gallbladder findings such as stone or cholecystitis.   We will update you on stat lab results as well.  Your EKG appears sinus rhythm with some nonspecific T wave changes.  More prominent in aVL lead.  Lead I looks normal.  I am getting opinion from cardiologist.  Presently he does agree with doing just 1 set of troponin.   If your signs and symptoms worsen or change recommend ED evaluation.  Presently prescribing famotidine to add to your med regimen.  Also making Zofran available for nausea and vomiting.  Follow-up date to be determined after lab review.

## 2021-01-26 NOTE — Progress Notes (Signed)
Subjective:    Patient ID: Luke Vasquez, male    DOB: [birth year], 55 y.o.   MRN: [id number]  HPI  Pt in for visit. He just had virtual visit.   "Pt has epigastric pain. Told As this is a virtual visit, video technology does not allow for your provider to perform a traditional examination.  This may limit your provider's ability to fully assess your condition.  If your provider identifies any concerns that need to be evaluated in person or the need to arrange testing (such as labs, EKG, etc.), we will make arrangements to do so.     Although advances in technology are sophisticated, we cannot ensure that it will always work on either your end or our end.  If the connection with a video visit is poor, the visit may have to be switched to a telephone visit.  With either a video or telephone visit, we are not always able to ensure that we have a secure connection"    So pt comes in to see me. Pt states pain since Saturday. Pt states pain is constant. Pt pain level is 8/10 at least. Pain constant. Pt states at first pain was middle of stomach but now more ruq. Pt point to belly button and epigastric area as source of pain initially. States had pancreatitis about 4-5 times. Pt states after eating pain is twice as bad. Pt states has been very nausea but not vomiting.   In the past pancreatitis occurred sometimes with and without alcohol use. He states most. This weekend drank 3-4 beers.   Pt has [place] scheduled for tomorrow.   Ct abdomen and pelvis 2020 aorta norma caliber.   No chest pain. No back pain.  No urinary symptoms.   Pt last ate at 9 am.  Has not eaten since.   On review pt has had hx of mi at age 26 yo.   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   Review of Systems  Constitutional:  Negative for chills, fatigue and fever.  HENT:  Negative for congestion.   Respiratory:  Negative for cough, chest  tightness, shortness of breath and wheezing.   Cardiovascular:  Negative for chest pain and palpitations.  Gastrointestinal:  Positive for abdominal pain and nausea. Negative for blood in stool, constipation and diarrhea.       No constipation.  Genitourinary:  Negative for difficulty urinating, dysuria, penile pain, scrotal swelling and urgency.  Musculoskeletal:  Negative for back pain, joint swelling and neck pain.   Past Medical History:  Diagnosis Date   Depression    GERD (gastroesophageal reflux disease)    History of heart attack 2013   Hypertension    Seasonal allergies      Social History   Socioeconomic History   Marital status: Married    Spouse name: Not on file   Number of children: Not on file   Years of education: Not on file   Highest education level: Not on file  Occupational History   Not on file  Tobacco Use   Smoking status: Former    Packs/day: 0.25    Years: 0.60    Pack years: 0.15    Types: Cigarettes   Smokeless tobacco: Never   Tobacco comments:    Briefly smoked  Substance and Sexual Activity   Alcohol use: Yes    Comment: socially   Drug use:  No   Sexual activity: Not on file  Other Topics Concern   Not on file  Social History Narrative   Not on file   Social Determinants of Health   Financial Resource Strain: Not on file  Food Insecurity: Not on file  Transportation Needs: Not on file  Physical Activity: Not on file  Stress: Not on file  Social Connections: Not on file  Intimate Partner Violence: Not on file    Past Surgical History:  Procedure Laterality Date   CORONARY STENT PLACEMENT  2013   LITHOTRIPSY      Family History  Problem Relation Age of Onset   Hypertension Mother    Gallbladder disease Mother    Hypertension Father    High blood pressure Brother    Colon cancer Neg Hx    Esophageal cancer Neg Hx    Inflammatory bowel disease Neg Hx    Liver disease Neg Hx    Pancreatic cancer Neg Hx    Rectal cancer  Neg Hx    Stomach cancer Neg Hx     Allergies  Allergen Reactions   Regadenoson Other (See Comments)    Other reaction(s): Hypotension, Other (See Comments) Other reaction(s): Hypotension, Other (See Comments), Other (see comments) Other reaction(s): Hypotension, Other (See Comments)     Current Outpatient Medications on File Prior to Visit  Medication Sig Dispense Refill   aspirin EC 81 MG tablet Take 81 mg by mouth daily.     BRILINTA 60 MG TABS tablet TAKE 1 TABLET BY MOUTH TWICE A DAY 60 tablet 2   cetirizine (ZYRTEC) 10 MG tablet Take 1 tablet (10 mg total) by mouth daily. 30 tablet 2   famotidine (PEPCID) 20 MG tablet Take 20 mg by mouth 2 (two) times daily.  0   FLOVENT HFA 110 MCG/ACT inhaler TAKE 2 PUFFS BY MOUTH TWICE A DAY IN THE MORNING & AT BEDTIME. RINSE MOUTH OUT AFTER USE 12 each 1   icosapent Ethyl (VASCEPA) 1 g capsule Take 2 capsules (2 g total) by mouth 2 (two) times daily. 120 capsule 3   lisinopril (ZESTRIL) 10 MG tablet TAKE 1 TABLET BY MOUTH EVERY DAY *INSURANCE REQUIRES 90 DAY SUPPLY 90 tablet 1   metoprolol tartrate (LOPRESSOR) 25 MG tablet TAKE 1/2 TABLET BY MOUTH TWICE DAILY 90 tablet 1   montelukast (SINGULAIR) 10 MG tablet TAKE 1 TABLET BY MOUTH EVERYDAY AT BEDTIME 90 tablet 1   omeprazole (PRILOSEC) 20 MG capsule TAKE 1 CAPSULE BY MOUTH TWICE A DAY BEFORE MEALS 180 capsule 3   promethazine (PHENERGAN) 25 MG tablet Take 1 tablet (25 mg total) by mouth every 6 (six) hours as needed for nausea or vomiting. 30 tablet 1   ranolazine (RANEXA) 500 MG 12 hr tablet TAKE 1 TABLET BY MOUTH TWICE A DAY 180 tablet 4   rosuvastatin (CRESTOR) 20 MG tablet Take 1 tablet by mouth once daily 30 tablet 0   traZODone (DESYREL) 50 MG tablet Take 0.5-1 tablets (25-50 mg total) by mouth at bedtime as needed for sleep. 30 tablet 3   No current facility-administered medications on file prior to visit.    BP 139/85   Pulse 78   Temp 97.7 F (36.5 C)   Ht 5\' 10"  (1.778 m)    Wt 235 lb (106.6 kg)   SpO2 96%   BMI 33.72 kg/m        Objective:   Physical Exam  General Mental Status- Alert. General Appearance- Not in acute distress.  Skin General: Color- Normal Color. Moisture- Normal Moisture.  Neck Carotid Arteries- Normal color. Moisture- Normal Moisture. No carotid bruits. No JVD.  Chest and Lung Exam Auscultation: Breath Sounds:-Normal.  Cardiovascular Auscultation:Rythm- Regular. Murmurs & Other Heart Sounds:Auscultation of the heart reveals- No Murmurs.  Abdomen Inspection:-Inspeection Normal. Palpation/Percussion:Note:No mass. Palpation and Percussion of the abdomen reveal-mild tender epigastric area and more tender right upper quadrant.,  Non Distended + BS, no rebound or guarding.  Some faint right lower quadrant tenderness as well.    Neurologic Cranial Nerve exam:- CN III-XII intact(No nystagmus), symmetric smile. Drift Test:- No drift. Romberg Exam:- Negative.  Heal to Toe Gait exam:-Normal. Finger to Nose:- Normal/Intact Strength:- 5/5 equal and symmetric strength both upper and lower extremities.       Assessment & Plan:   Patient Instructions  History of severe side epigastric and right upper quadrant pain.  Various times in the past pancreatitis and suspect likely going on presently.  We will get stat CBC, CMP, lipase and amylase.  Including 1 set troponin based on your past cardiac history.    Recommend not to eat or drink anything until 5PM.  I did get your ultrasound scheduled earlier.  I will get stat read and see if any gallbladder findings such as stone or cholecystitis.   We will update you on stat lab results as well.  Your EKG appears sinus rhythm with some nonspecific T wave changes.  More prominent in aVL lead.  Lead I looks normal.  I am getting opinion from cardiologist.  Presently he does agree with doing just 1 set of troponin.   If your signs and symptoms worsen or change recommend ED  evaluation.  Presently prescribing famotidine to add to your med regimen.  Also making Zofran available for nausea and vomiting.  Follow-up date to be determined after lab review.    Mackie Pai, PA-C     Time spent with patient today was 41  minutes which consisted of chart review, discussing diagnosis, work up, treatment and documentation.

## 2021-01-26 NOTE — Addendum Note (Signed)
Addended by: Anabel Halon on: 01/26/2021 02:13 PM   Modules accepted: Level of Service

## 2021-01-27 ENCOUNTER — Telehealth: Payer: Self-pay | Admitting: Medical

## 2021-01-27 ENCOUNTER — Ambulatory Visit (HOSPITAL_BASED_OUTPATIENT_CLINIC_OR_DEPARTMENT_OTHER): Payer: 59

## 2021-01-27 ENCOUNTER — Encounter (HOSPITAL_BASED_OUTPATIENT_CLINIC_OR_DEPARTMENT_OTHER): Payer: Self-pay

## 2021-01-27 ENCOUNTER — Encounter: Payer: Self-pay | Admitting: Medical

## 2021-01-27 NOTE — Telephone Encounter (Signed)
Placed ct abd pelvis order stat.

## 2021-01-27 NOTE — Telephone Encounter (Signed)
Before leaving today I was looking at labs and imaging result notes and did not see that CT abdomen pelvis was done.  I did ask referral staff to get prior authorization and to help coordinate the CT today.  I called downstairs to investigate and patient was on the schedule at 1230. Study not done? I asked if patient could get scheduled this evening rather than waiting further.  Staff member said should be able to get him in tonight.  I calle pt  hoping to speak with patient directly but only got his answering machine.  I left message explaining to try to call directly to the radiology department.  If unable to get through then try to call the med center direct number.  He should be able to talk with receptionist/xray stafff and get coordinated for the study tonight.   Sending this message to staff to follow-up on this tomorrow morning.   On message that I left the patient did reiterate again that if he has worsening or changing symptoms then be seen in the emergency department.

## 2021-01-27 NOTE — Telephone Encounter (Signed)
Opened to review 

## 2021-01-27 NOTE — Addendum Note (Signed)
Addended by: Anabel Halon on: 01/27/2021 12:40 PM   Modules accepted: Orders

## 2021-01-28 ENCOUNTER — Ambulatory Visit (HOSPITAL_BASED_OUTPATIENT_CLINIC_OR_DEPARTMENT_OTHER): Payer: 59

## 2021-01-28 NOTE — Telephone Encounter (Signed)
Pt is scheduled for ct today at 3

## 2021-01-30 ENCOUNTER — Telehealth: Payer: 59 | Admitting: Emergency Medicine

## 2021-01-30 ENCOUNTER — Encounter: Payer: Self-pay | Admitting: Medical

## 2021-01-30 DIAGNOSIS — Z20822 Contact with and (suspected) exposure to covid-19: Secondary | ICD-10-CM

## 2021-01-30 DIAGNOSIS — J069 Acute upper respiratory infection, unspecified: Secondary | ICD-10-CM

## 2021-01-30 NOTE — Progress Notes (Signed)
Based on the information that you have shared in the e-Visit Questionnaire, we recommend that you convert this visit to a video visit in order for the provider to better assess what is going on.  The provider will be able to give you a more accurate diagnosis and treatment plan if we can more freely discuss your symptoms and with the addition of a virtual examination.   If you convert to a video visit, we will bill your insurance (similar to an office visit) and you will not be charged for this e-Visit. You will be able to stay at home and speak with the first available [facility] advanced practice provider. The link to do a video visit is in the drop down Menu tab of your Welcome screen in [place].  IF YOU DO NOT WANT TO DO A VIRTUAL VISIT YOU MAY VISIT ONE OF OUR URGENT CARE SITES.  YOU MOST LIKELY HAVE COVID AND WOULD BENEFIT FROM ANTIVIRAL THERAPY, WHICH WE ARE UNABLE TO PRESCRIBED THROUGH AN EVISIT   Based on what you shared with me, I feel your condition warrants further evaluation and I recommend that you be seen in a face to face visit.   NOTE: There will be NO CHARGE for this eVisit   If you are having a true medical emergency please call 911.      For an urgent face to face visit, [place] has six urgent care centers for your convenience:     [facility] [place] [facility] [place] Get Driving Directions [phone] [place], [place] [address]    [facility]) Get Driving Directions [phone] [place], [place] [address]  [facility] [place] ([place]) Get Driving Directions [phone] [address] [place],  [place]  [address]  [facility] Get Driving Directions [phone] [address] [place], [place], [place] [address]   [facility] [place] [address] [place], [place] [address]   [facility] [place] Get Driving Directions [phone] [address], [place], [place] [address]  Your MyChart E-visit questionnaire answers were reviewed by a board certified advanced clinical practitioner to complete your personal care plan based on your specific symptoms.  Thank you for using e-Visits.

## 2021-02-01 ENCOUNTER — Encounter: Payer: Self-pay | Admitting: Family Medicine

## 2021-02-01 ENCOUNTER — Other Ambulatory Visit: Payer: Self-pay

## 2021-02-01 ENCOUNTER — Telehealth (INDEPENDENT_AMBULATORY_CARE_PROVIDER_SITE_OTHER): Payer: 59 | Admitting: Family Medicine

## 2021-02-01 VITALS — Temp 101.5°F

## 2021-02-01 DIAGNOSIS — U071 COVID-19: Secondary | ICD-10-CM

## 2021-02-01 MED ORDER — PREDNISONE 20 MG PO TABS: 40.0000 mg | ORAL_TABLET | Freq: Every day | ORAL | 0 refills | Status: AC

## 2021-02-01 MED ORDER — BENZONATATE 100 MG PO CAPS: 100.0000 mg | ORAL_CAPSULE | Freq: Three times a day (TID) | ORAL | 0 refills | Status: DC | PRN

## 2021-02-01 MED ORDER — MOLNUPIRAVIR EUA 200MG CAPSULE: 4.0000 | ORAL_CAPSULE | Freq: Two times a day (BID) | ORAL | 0 refills | Status: AC

## 2021-02-01 MED ORDER — ONDANSETRON 4 MG PO TBDP: 4.0000 mg | ORAL_TABLET | Freq: Three times a day (TID) | ORAL | 0 refills | Status: DC | PRN

## 2021-02-01 NOTE — Progress Notes (Signed)
Chief Complaint  Patient presents with   Cough   Sore Throat    Congestion Fatigue Home test today positive for Covid     Marene Lenz here for URI complaints. Due to COVID-19 pandemic, we are interacting via web portal for an electronic face-to-face visit. I verified patient's ID using 2 identifiers. Patient agreed to proceed with visit via this method. Patient is at home, I am at office. Patient and I are present for visit.   Duration: 2 days  Associated symptoms: Fever (101.5 F), sinus headache, sinus congestion, rhinorrhea, ear pain, wheezing, myalgia, and coughing, fatigue, N/V Denies: sinus pain, itchy watery eyes, ear drainage, sore throat, shortness of breath, and diarrhea, loss of taste/smell Treatment to date: Delsym, Tylenol Sick contacts: Yes- wife tested positive He tested + on 10/16.   Past Medical History:  Diagnosis Date   Depression    GERD (gastroesophageal reflux disease)    History of heart attack 2013   Hypertension    Seasonal allergies     Objective Temp (!) 101.5 F (38.6 C) (Oral)  No conversational dyspnea Age appropriate judgment and insight Nml affect and mood   COVID-19 - Plan: predniSONE (DELTASONE) 20 MG tablet, ondansetron (ZOFRAN-ODT) 4 MG disintegrating tablet, benzonatate (TESSALON) 100 MG capsule, molnupiravir EUA (LAGEVRIO) 200 mg CAPS capsule  Symptomatic care as above, given weight nad hx, will do 5 d of molnupiravir.  Continue to push fluids, practice good hand hygiene, cover mouth when coughing. F/u prn. If starting to experience irreplaceable fluid loss, shaking, or shortness of breath, seek immediate care. Pt voiced understanding and agreement to the plan.  [place], DO 02/01/21 11:59 AM

## 2021-02-08 ENCOUNTER — Encounter: Payer: Self-pay | Admitting: Family Medicine

## 2021-02-09 ENCOUNTER — Encounter: Payer: Self-pay | Admitting: Medical

## 2021-02-10 ENCOUNTER — Encounter: Payer: 59 | Admitting: Family Medicine

## 2021-02-10 NOTE — Telephone Encounter (Signed)
error 

## 2021-02-23 ENCOUNTER — Other Ambulatory Visit: Payer: Self-pay | Admitting: Family Medicine

## 2021-02-24 ENCOUNTER — Other Ambulatory Visit: Payer: Self-pay | Admitting: Medical

## 2021-02-24 ENCOUNTER — Encounter: Payer: Self-pay | Admitting: Family Medicine

## 2021-02-24 ENCOUNTER — Other Ambulatory Visit: Payer: Self-pay

## 2021-02-24 ENCOUNTER — Telehealth (INDEPENDENT_AMBULATORY_CARE_PROVIDER_SITE_OTHER): Payer: 59 | Admitting: Family Medicine

## 2021-02-24 DIAGNOSIS — R058 Other specified cough: Secondary | ICD-10-CM

## 2021-02-24 MED ORDER — FLUTICASONE PROPIONATE HFA 110 MCG/ACT IN AERO: 2.0000 | INHALATION_SPRAY | Freq: Two times a day (BID) | RESPIRATORY_TRACT | 1 refills | Status: AC

## 2021-02-24 MED ORDER — BENZONATATE 200 MG PO CAPS: 200.0000 mg | ORAL_CAPSULE | Freq: Two times a day (BID) | ORAL | 0 refills | Status: DC | PRN

## 2021-02-24 MED ORDER — FLUTICASONE PROPIONATE 50 MCG/ACT NA SUSP: 2.0000 | Freq: Every day | NASAL | 2 refills | Status: AC

## 2021-02-24 NOTE — Progress Notes (Signed)
Chief Complaint  Patient presents with   Cough    Marene Lenz here for URI complaints. Due to COVID-19 pandemic, we are interacting via web portal for an electronic face-to-face visit. I verified patient's ID using 2 identifiers. Patient agreed to proceed with visit via this method. Patient is at home, I am at office. Patient and I are present for visit.   Duration: Continuation of covid Associated symptoms: sinus congestion and coughing Denies: sinus pain, rhinorrhea, itchy watery eyes, ear pain, ear drainage, sore throat, wheezing, shortness of breath, myalgia, and fevers Treatment to date: Tessalon Perles Sick contacts: No  Past Medical History:  Diagnosis Date   Depression    GERD (gastroesophageal reflux disease)    History of heart attack 2013   Hypertension    Seasonal allergies     Objective No conversational dyspnea Age appropriate judgment and insight Nml affect and mood  Post-viral cough syndrome - Plan: benzonatate (TESSALON) 200 MG capsule, fluticasone (FLOVENT HFA) 110 MCG/ACT inhaler, fluticasone (FLONASE) 50 MCG/ACT nasal spray  Increase benzonatate, add ICS, add back INCS. Will take time. Continue to push fluids, practice good hand hygiene, cover mouth when coughing. F/u prn. If starting to experience fevers, shaking, or shortness of breath, seek immediate care. Pt voiced understanding and agreement to the plan.  [place], DO 02/24/21 3:23 PM

## 2021-02-26 ENCOUNTER — Telehealth: Payer: 59 | Admitting: Medical

## 2021-02-26 MED ORDER — TICAGRELOR 60 MG PO TABS: 60.0000 mg | ORAL_TABLET | Freq: Two times a day (BID) | ORAL | 2 refills | Status: DC

## 2021-03-05 ENCOUNTER — Other Ambulatory Visit: Payer: Self-pay | Admitting: Medical

## 2021-03-14 ENCOUNTER — Other Ambulatory Visit: Payer: Self-pay | Admitting: Family Medicine

## 2021-03-14 DIAGNOSIS — J069 Acute upper respiratory infection, unspecified: Secondary | ICD-10-CM

## 2021-03-16 ENCOUNTER — Encounter: Payer: Self-pay | Admitting: Family Medicine

## 2021-03-16 ENCOUNTER — Other Ambulatory Visit: Payer: Self-pay | Admitting: Family Medicine

## 2021-03-16 DIAGNOSIS — I2510 Atherosclerotic heart disease of native coronary artery without angina pectoris: Secondary | ICD-10-CM

## 2021-03-16 MED ORDER — ROSUVASTATIN CALCIUM 20 MG PO TABS: 20.0000 mg | ORAL_TABLET | Freq: Every day | ORAL | 0 refills | Status: DC

## 2021-03-17 ENCOUNTER — Encounter: Payer: 59 | Admitting: Family Medicine

## 2021-05-02 ENCOUNTER — Encounter: Payer: Self-pay | Admitting: Family Medicine

## 2021-05-03 MED ORDER — TICAGRELOR 60 MG PO TABS: 60.0000 mg | ORAL_TABLET | Freq: Two times a day (BID) | ORAL | 2 refills | Status: DC

## 2021-05-11 ENCOUNTER — Encounter: Payer: Self-pay | Admitting: Family Medicine

## 2021-05-11 MED ORDER — MONTELUKAST SODIUM 10 MG PO TABS: 10.0000 mg | ORAL_TABLET | Freq: Every day | ORAL | 1 refills | Status: DC

## 2021-05-12 ENCOUNTER — Telehealth (INDEPENDENT_AMBULATORY_CARE_PROVIDER_SITE_OTHER): Payer: Self-pay | Admitting: Family Medicine

## 2021-05-12 ENCOUNTER — Encounter: Payer: Self-pay | Admitting: Family Medicine

## 2021-05-12 DIAGNOSIS — J069 Acute upper respiratory infection, unspecified: Secondary | ICD-10-CM

## 2021-05-12 DIAGNOSIS — H938X1 Other specified disorders of right ear: Secondary | ICD-10-CM

## 2021-05-12 MED ORDER — METHYLPREDNISOLONE 4 MG PO TBPK: ORAL_TABLET | ORAL | 0 refills | Status: DC

## 2021-05-12 MED ORDER — PREDNISONE 20 MG PO TABS: 40.0000 mg | ORAL_TABLET | Freq: Every day | ORAL | 0 refills | Status: DC

## 2021-05-12 MED ORDER — PSEUDOEPHEDRINE HCL 30 MG PO TABS: 30.0000 mg | ORAL_TABLET | ORAL | 0 refills | Status: DC | PRN

## 2021-05-12 MED ORDER — BENZONATATE 200 MG PO CAPS: 200.0000 mg | ORAL_CAPSULE | Freq: Two times a day (BID) | ORAL | 0 refills | Status: DC | PRN

## 2021-05-12 NOTE — Progress Notes (Signed)
Chief Complaint  Patient presents with   Cough    congestion    Luke Vasquez here for URI complaints. Due to COVID-19 pandemic, we are interacting via web portal for an electronic face-to-face visit. I verified patient's ID using 2 identifiers. Patient agreed to proceed with visit via this method. Patient is at home, I am at office. Patient and I are present for visit.   Duration: 1 week  Associated symptoms: sinus headache, sinus congestion, rhinorrhea, ear fullness, sore throat, wheezing, myalgia, and coughing Denies: sinus pain, itchy watery eyes, ear pain, ear drainage, fevers, and shortness of breath Treatment to date: Mucinex Sick contacts: No Tested neg for covid x 2.   Past Medical History:  Diagnosis Date   Depression    GERD (gastroesophageal reflux disease)    History of heart attack 2013   Hypertension    Seasonal allergies     Objective No conversational dyspnea Age appropriate judgment and insight Nml affect and mood  Viral URI with cough - Plan: benzonatate (TESSALON) 200 MG capsule, pseudoephedrine (SUDAFED) 30 MG tablet  Ear fullness, right - Plan: methylPREDNISolone (MEDROL DOSEPAK) 4 MG TBPK tablet, DISCONTINUED: predniSONE (DELTASONE) 20 MG tablet  Pt slightly better overall, would like some meds to help. Tessalon Perles worked well for him in past w coughing, will resend. Sudafed prn. Medrol Dosepak for ear issue.  Continue to push fluids, practice good hand hygiene, cover mouth when coughing. F/u prn. If starting to experience fevers, shaking, or shortness of breath, seek immediate care. Pt voiced understanding and agreement to the plan.  [place], DO 05/12/21 11:55 AM

## 2021-05-21 DIAGNOSIS — E7800 Pure hypercholesterolemia, unspecified: Secondary | ICD-10-CM | POA: Insufficient documentation

## 2021-05-24 ENCOUNTER — Encounter: Payer: Self-pay | Admitting: Family Medicine

## 2021-05-24 DIAGNOSIS — I2510 Atherosclerotic heart disease of native coronary artery without angina pectoris: Secondary | ICD-10-CM

## 2021-05-24 MED ORDER — ROSUVASTATIN CALCIUM 20 MG PO TABS: 20.0000 mg | ORAL_TABLET | Freq: Every day | ORAL | 1 refills | Status: DC

## 2021-06-08 ENCOUNTER — Encounter: Payer: Self-pay | Admitting: Family Medicine

## 2021-06-08 MED ORDER — LISINOPRIL 10 MG PO TABS: ORAL_TABLET | ORAL | 1 refills | Status: DC

## 2021-06-18 ENCOUNTER — Encounter: Payer: Self-pay | Admitting: Family Medicine

## 2021-06-18 ENCOUNTER — Ambulatory Visit (INDEPENDENT_AMBULATORY_CARE_PROVIDER_SITE_OTHER): Payer: BC Managed Care – PPO | Admitting: Family Medicine

## 2021-06-18 VITALS — BP 130/82 | HR 73 | Temp 98.0°F | Resp 16 | Ht 70.0 in | Wt 245.0 lb

## 2021-06-18 DIAGNOSIS — Z1159 Encounter for screening for other viral diseases: Secondary | ICD-10-CM

## 2021-06-18 DIAGNOSIS — M791 Myalgia, unspecified site: Secondary | ICD-10-CM | POA: Diagnosis not present

## 2021-06-18 DIAGNOSIS — R5383 Other fatigue: Secondary | ICD-10-CM | POA: Diagnosis not present

## 2021-06-18 LAB — CBC
HCT: 47.5 % (ref 39.0–52.0)
Hemoglobin: 16.4 g/dL (ref 13.0–17.0)
MCHC: 34.6 g/dL (ref 30.0–36.0)
MCV: 94 fl (ref 78.0–100.0)
Platelets: 174 10*3/uL (ref 150.0–400.0)
RBC: 5.05 Mil/uL (ref 4.22–5.81)
RDW: 13.6 % (ref 11.5–15.5)
WBC: 6.6 10*3/uL (ref 4.0–10.5)

## 2021-06-18 LAB — COMPREHENSIVE METABOLIC PANEL
ALT: 27 U/L (ref 0–53)
AST: 17 U/L (ref 0–37)
Albumin: 4.4 g/dL (ref 3.5–5.2)
Alkaline Phosphatase: 46 U/L (ref 39–117)
BUN: 22 mg/dL (ref 6–23)
CO2: 26 mEq/L (ref 19–32)
Calcium: 9.2 mg/dL (ref 8.4–10.5)
Chloride: 104 mEq/L (ref 96–112)
Creatinine, Ser: 1.22 mg/dL (ref 0.40–1.50)
GFR: 67.03 mL/min (ref >60.00)
Glucose, Bld: 115 mg/dL — ABNORMAL HIGH (ref 70–99)
Potassium: 4 mEq/L (ref 3.5–5.1)
Sodium: 138 mEq/L (ref 135–145)
Total Bilirubin: 0.7 mg/dL (ref 0.2–1.2)
Total Protein: 6.7 g/dL (ref 6.0–8.3)

## 2021-06-18 LAB — CK: Total CK: 68 U/L (ref 7–232)

## 2021-06-18 LAB — TSH: TSH: 1.87 u[IU]/mL (ref 0.35–5.50)

## 2021-06-18 MED ORDER — CLOPIDOGREL BISULFATE 75 MG PO TABS: 75.0000 mg | ORAL_TABLET | Freq: Every day | ORAL | Status: DC

## 2021-06-18 MED ORDER — TRAMADOL HCL 50 MG PO TABS: 50.0000 mg | ORAL_TABLET | Freq: Three times a day (TID) | ORAL | 0 refills | Status: AC | PRN

## 2021-06-18 NOTE — Progress Notes (Signed)
Chief Complaint  ?Patient presents with  ? Fatigue  ?  Here for Fatigue and pain   ? ? ?Subjective: ?Patient is a 56 y.o. male here for fatigue. ? ?2 weeks ago started having worsening fatigue and diffuse pain. He will sleep 8+ hrs nightly and wake up feeling very tired. He does snore at night. No inj or change in activity, fevers, sob, weight loss, nausea, vomiting, or diarrhea. Tried Delsym and allergy medication.  Mood is stable and he denies any depression or anxiety. ? ?Past Medical History:  ?Diagnosis Date  ? Depression   ? GERD (gastroesophageal reflux disease)   ? History of heart attack 2013  ? Hypertension   ? Seasonal allergies   ? ? ?Objective: ?BP 130/82 (BP Location: Right Arm, Patient Position: Sitting, Cuff Size: Normal)   Pulse 73   Temp 98 ?F (36.7 ?C) (Oral)   Resp 16   Ht 5\' 10"  (1.778 m)   Wt 245 lb (111.1 kg)   SpO2 94%   BMI 35.15 kg/m?  ?General: Awake, appears stated age ?Heart: RRR, no LE edema ?Lungs: CTAB, no rales, wheezes or rhonchi. No accessory muscle use ?MSK: Tenderness to palpation over bilateral forearms, and lower extremities bilaterally; I do not appreciate any swelling ?Psych: Age appropriate judgment and insight, normal affect and mood ? ?Assessment and Plan: ?Other fatigue - Plan: TSH, CBC, Comprehensive metabolic panel ? ?Myalgia - Plan: CK (Creatine Kinase), traMADol (ULTRAM) 50 MG tablet ? ?Encounter for hepatitis C screening test for low risk patient - Plan: Hepatitis C antibody ? ?New problem, uncertain prognosis.  Check labs today.  Counseled on diet and exercise.  Stay hydrated.  If labs are normal, we will refer to the sleep team for evaluation for possible sleep apnea. ?Hold rosuvastatin for 2 weeks.  Stay hydrated.  He will let me know in 2 weeks on MyChart if he is not having any improvement.  We will send in tramadol as needed.  He has been on this before and tolerated it well. ?Follow-up pending the above. ?The patient voiced understanding and agreement  to the plan. ? ?Shelda Pal, DO ?06/18/21  ?11:56 AM ? ? ? ? ?

## 2021-06-18 NOTE — Patient Instructions (Addendum)
Stop the Crestor/rosuvastatin for 2 weeks. This could be causing your aches. Let me know if the pain returns in 2 weeks on MyChart. ? ?Stay hydrated.  ? ?Give [place] 2-3 business days to get the results of your labs back. If labs are normal, we will refer you to the sleep team to further evaluate possible sleep apnea.  ? ?Let us know if you need anything. ?

## 2021-06-21 LAB — HEPATITIS C ANTIBODY
Hepatitis C Ab: NONREACTIVE
SIGNAL TO CUT-OFF: <0.02 (ref <1.00)

## 2021-07-05 ENCOUNTER — Encounter: Payer: Self-pay | Admitting: Family Medicine

## 2021-07-14 DIAGNOSIS — H00022 Hordeolum internum right lower eyelid: Secondary | ICD-10-CM | POA: Diagnosis not present

## 2021-07-19 ENCOUNTER — Telehealth: Payer: Self-pay | Admitting: Family Medicine

## 2021-07-19 ENCOUNTER — Encounter: Payer: Self-pay | Admitting: Family Medicine

## 2021-07-19 ENCOUNTER — Ambulatory Visit (INDEPENDENT_AMBULATORY_CARE_PROVIDER_SITE_OTHER): Payer: BC Managed Care – PPO

## 2021-07-19 ENCOUNTER — Ambulatory Visit (INDEPENDENT_AMBULATORY_CARE_PROVIDER_SITE_OTHER): Payer: BC Managed Care – PPO | Admitting: Family Medicine

## 2021-07-19 VITALS — BP 122/82 | HR 77 | Temp 96.3°F | Ht 70.0 in | Wt 246.2 lb

## 2021-07-19 DIAGNOSIS — M25571 Pain in right ankle and joints of right foot: Secondary | ICD-10-CM | POA: Diagnosis not present

## 2021-07-19 DIAGNOSIS — M7989 Other specified soft tissue disorders: Secondary | ICD-10-CM | POA: Diagnosis not present

## 2021-07-19 MED ORDER — AIRCAST AIRSPORT ANKLE BRACE MISC: 1.0000 | 0 refills | Status: AC | PRN

## 2021-07-19 MED ORDER — NAPROXEN 250 MG PO TABS: 250.0000 mg | ORAL_TABLET | Freq: Two times a day (BID) | ORAL | 0 refills | Status: AC | PRN

## 2021-07-19 NOTE — Progress Notes (Signed)
? ?Acute Office Visit ? ?Subjective:  ? ? Patient ID: Luke Vasquez, male    DOB: [birth year], 56 y.o.   MRN: [id number] ? ?Chief Complaint  ?Patient presents with  ? Acute Visit  ?  Pt c/o extreme heel pain rt foot x 2 month.  ? ? ?Ankle Pain  ?Incident onset: 1 month ago. Injury mechanism: missed last step on stairs and came down "hard on foot" The pain is present in the right heel. The quality of the pain is described as aching. The pain is at a severity of 6/10. The pain is moderate. The pain has been Worsening since onset. Associated symptoms include an inability to bear weight. Pertinent negatives include no loss of motion, loss of sensation, muscle weakness, numbness or tingling. He reports no foreign bodies present. The symptoms are aggravated by weight bearing and palpation. He has tried acetaminophen for the symptoms. The treatment provided no relief.  ? ?Past Medical History:  ?Diagnosis Date  ? Depression   ? GERD (gastroesophageal reflux disease)   ? History of heart attack 2013  ? Hypertension   ? Seasonal allergies   ? ? ?Past Surgical History:  ?Procedure Laterality Date  ? CORONARY STENT PLACEMENT  2013  ? LITHOTRIPSY    ? ? ?Family History  ?Problem Relation Age of Onset  ? Hypertension Mother   ? Gallbladder disease Mother   ? Hypertension Father   ? High blood pressure Brother   ? Colon cancer Neg Hx   ? Esophageal cancer Neg Hx   ? Inflammatory bowel disease Neg Hx   ? Liver disease Neg Hx   ? Pancreatic cancer Neg Hx   ? Rectal cancer Neg Hx   ? Stomach cancer Neg Hx   ? ? ?Social History  ? ?Socioeconomic History  ? Marital status: Married  ?  Spouse name: Not on file  ? Number of children: Not on file  ? Years of education: Not on file  ? Highest education level: Not on file  ?Occupational History  ? Not on file  ?Tobacco Use  ? Smoking status: Former  ?  Packs/day: 0.25  ?  Years: 0.60  ?  Pack years: 0.15  ?  Types: Cigarettes  ? Smokeless tobacco: Never  ? Tobacco comments:  ?  Briefly  smoked  ?Substance and Sexual Activity  ? Alcohol use: Yes  ?  Comment: socially  ? Drug use: No  ? Sexual activity: Not on file  ?Other Topics Concern  ? Not on file  ?Social History Narrative  ? Not on file  ? ?Social Determinants of Health  ? ?Financial Resource Strain: Not on file  ?Food Insecurity: Not on file  ?Transportation Needs: Not on file  ?Physical Activity: Not on file  ?Stress: Not on file  ?Social Connections: Not on file  ?Intimate Partner Violence: Not on file  ? ? ?Outpatient Medications Prior to Visit  ?Medication Sig Dispense Refill  ? aspirin EC 81 MG tablet Take 81 mg by mouth daily.    ? benzonatate (TESSALON) 200 MG capsule Take 1 capsule (200 mg total) by mouth 2 (two) times daily as needed for cough. 20 capsule 0  ? cetirizine (ZYRTEC) 10 MG tablet Take 1 tablet (10 mg total) by mouth daily. 30 tablet 2  ? famotidine (PEPCID) 20 MG tablet TAKE 1 TABLET BY MOUTH EVERY DAY 90 tablet 1  ? fluticasone (FLONASE) 50 MCG/ACT nasal spray Place 2 sprays into both nostrils daily. [place]  g 2  ? icosapent Ethyl (VASCEPA) 1 g capsule Take 2 capsules (2 g total) by mouth 2 (two) times daily. 120 capsule 3  ? lisinopril (ZESTRIL) 10 MG tablet TAKE 1 TABLET BY MOUTH EVERY DAY *INSURANCE REQUIRES 90 DAY SUPPLY 90 tablet 1  ? metoprolol tartrate (LOPRESSOR) 25 MG tablet TAKE 1/2 TABLET BY MOUTH TWICE A DAY 90 tablet 1  ? montelukast (SINGULAIR) 10 MG tablet Take 1 tablet (10 mg total) by mouth at bedtime. 90 tablet 1  ? omeprazole (PRILOSEC) 20 MG capsule TAKE 1 CAPSULE BY MOUTH TWICE A DAY BEFORE MEALS 180 capsule 3  ? promethazine (PHENERGAN) 25 MG tablet Take 1 tablet (25 mg total) by mouth every 6 (six) hours as needed for nausea or vomiting. 30 tablet 1  ? pseudoephedrine (SUDAFED) 30 MG tablet Take 1 tablet (30 mg total) by mouth every 4 (four) hours as needed for congestion. 30 tablet 0  ? ranolazine (RANEXA) 500 MG 12 hr tablet TAKE 1 TABLET BY MOUTH TWICE A DAY 180 tablet 4  ? rosuvastatin (CRESTOR) 20  MG tablet Take 1 tablet (20 mg total) by mouth daily. 90 tablet 1  ? clopidogrel (PLAVIX) 75 MG tablet Take 1 tablet (75 mg total) by mouth daily. 90 tablet   ? fluticasone (FLOVENT HFA) 110 MCG/ACT inhaler Inhale 2 puffs into the lungs in the morning and at bedtime. Rinse mouth out after use. 1 each 1  ? traZODone (DESYREL) 50 MG tablet Take 0.5-1 tablets (25-50 mg total) by mouth at bedtime as needed for sleep. 30 tablet 3  ? ?No facility-administered medications prior to visit.  ? ? ?Allergies  ?Allergen Reactions  ? Regadenoson Other (See Comments)  ?  Other reaction(s): Hypotension, Other (See Comments) ?Other reaction(s): Hypotension, Other (See Comments), Other (see comments) ?Other reaction(s): Hypotension, Other (See Comments) ?  ? ? ?Review of Systems  ?Neurological:  Negative for tingling and numbness.  ?All other systems reviewed and are negative. ? ?   ?Objective:  ?  ?Physical Exam ? ?Gen: NAD, resting comfortably ?CV: RRR with no murmurs appreciated ?Pulm: NWOB, CTAB with no crackles, wheezes, or rhonchi ?GI: Normal bowel sounds present. Soft, Nontender, Nondistended. ?MSK: R ankle, TTP at base of R heel, normal ankle range of motion, overlying skin intact ?Skin: warm, dry ?Neuro: grossly normal, moves all extremities ?Psych: Normal affect and thought content ? ?There were no vitals taken for this visit. ?Wt Readings from Last 3 Encounters:  ?06/18/21 245 lb (111.1 kg)  ?01/26/21 235 lb (106.6 kg)  ?12/01/20 220 lb (99.8 kg)  ? ? ?There are no preventive care reminders to display for this patient. ? ?There are no preventive care reminders to display for this patient. ? ? ?Lab Results  ?Component Value Date  ? TSH 1.87 06/18/2021  ? ?Lab Results  ?Component Value Date  ? WBC 6.6 06/18/2021  ? HGB 16.4 06/18/2021  ? HCT 47.5 06/18/2021  ? MCV 94.0 06/18/2021  ? PLT 174.0 06/18/2021  ? ?Lab Results  ?Component Value Date  ? NA 138 06/18/2021  ? K 4.0 06/18/2021  ? CO2 26 06/18/2021  ? GLUCOSE 115 (H)  06/18/2021  ? BUN 22 06/18/2021  ? CREATININE 1.22 06/18/2021  ? BILITOT 0.7 06/18/2021  ? ALKPHOS 46 06/18/2021  ? AST 17 06/18/2021  ? ALT 27 06/18/2021  ? PROT 6.7 06/18/2021  ? ALBUMIN 4.4 06/18/2021  ? CALCIUM 9.2 06/18/2021  ? ANIONGAP 9 09/27/2018  ? GFR 67.03 06/18/2021  ? ?  No results found for: CHOL ?No results found for: HDL ?No results found for: [place] ?No results found for: TRIG ?No results found for: CHOLHDL ?No results found for: HGBA1C ? ?   ?Assessment & Plan:  ?Right ankle pain ?Along heel, likely occurred when stepping off a step and bore weight to heavily, other things that may have worsened her prolonged injury include being on feet a lot due to work, obesity ?Patient intolerant of steroids due to insomnia, also has cardiovascular history ?Trial of low-dose naproxen, 250 mg twice daily as needed ?Pneumatic boot Rx written ?Offered to write patient out of work, but declines at this time ?X-ray did not show any fracture with calcaneal spurs on initial read, overread pending ?Follow-up in 2 weeks if no improvement ? ? ?No orders of the defined types were placed in this encounter. ? ? ? ?Bonnita [place], MD ? ?

## 2021-07-19 NOTE — Telephone Encounter (Signed)
Pt called back and said her was having a hard time finding somewhere that had the boot, He said he tried [facility] and a couple of other pharmacies and they didn't have it, He didn't try [facility]. He asked if we could help him. Please advise. He did ask for a call back as well to let him know at [phone].  ?

## 2021-07-19 NOTE — Telephone Encounter (Signed)
Pt notified and verbalized understanding.  Patient states he will call back for more info if needed. ?

## 2021-07-19 NOTE — Telephone Encounter (Signed)
Appt scheduled

## 2021-07-20 NOTE — Progress Notes (Signed)
Patient has soft tissue swelling but no fracture, please call and inform.

## 2021-07-29 DIAGNOSIS — M722 Plantar fascial fibromatosis: Secondary | ICD-10-CM | POA: Diagnosis not present

## 2021-07-29 DIAGNOSIS — M216X9 Other acquired deformities of unspecified foot: Secondary | ICD-10-CM | POA: Diagnosis not present

## 2021-07-29 DIAGNOSIS — M79671 Pain in right foot: Secondary | ICD-10-CM | POA: Diagnosis not present

## 2021-08-03 ENCOUNTER — Encounter: Payer: Self-pay | Admitting: Family Medicine

## 2021-08-03 MED ORDER — MONTELUKAST SODIUM 10 MG PO TABS: 10.0000 mg | ORAL_TABLET | Freq: Every day | ORAL | 3 refills | Status: DC

## 2021-08-05 DIAGNOSIS — M79671 Pain in right foot: Secondary | ICD-10-CM | POA: Diagnosis not present

## 2021-08-09 ENCOUNTER — Encounter: Payer: Self-pay | Admitting: Family Medicine

## 2021-08-09 ENCOUNTER — Telehealth (INDEPENDENT_AMBULATORY_CARE_PROVIDER_SITE_OTHER): Payer: BC Managed Care – PPO | Admitting: Family Medicine

## 2021-08-09 DIAGNOSIS — J40 Bronchitis, not specified as acute or chronic: Secondary | ICD-10-CM

## 2021-08-09 MED ORDER — AZITHROMYCIN 250 MG PO TABS: ORAL_TABLET | ORAL | 0 refills | Status: DC

## 2021-08-09 MED ORDER — BENZONATATE 200 MG PO CAPS: 200.0000 mg | ORAL_CAPSULE | Freq: Two times a day (BID) | ORAL | 0 refills | Status: DC | PRN

## 2021-08-09 MED ORDER — DEXAMETHASONE 6 MG PO TABS: 12.0000 mg | ORAL_TABLET | Freq: Every day | ORAL | 0 refills | Status: AC

## 2021-08-09 NOTE — Progress Notes (Signed)
Chief Complaint  ?Patient presents with  ? Cough  ? ? ?Marene Lenz here for URI complaints. Due to COVID-19 pandemic, we are interacting via web portal for an electronic face-to-face visit. I verified patient's ID using 2 identifiers. Patient agreed to proceed with visit via this method. Patient is at home, I am at office. Patient and I are present for visit.  ? ?Duration: 1 week; getting worse over past couple days ?Associated symptoms: subjective fever, sinus pain, rhinorrhea, itchy watery eyes, ear fullness, wheezing, myalgia, and productive cough ?Denies: sinus congestion, ear pain, ear drainage, sore throat, shortness of breath, and N/V/D ?Treatment to date: Flonase, Flovent ?Sick contacts: No ? ?Past Medical History:  ?Diagnosis Date  ? Depression   ? GERD (gastroesophageal reflux disease)   ? History of heart attack 2013  ? Hypertension   ? Seasonal allergies   ? ? ?Objective ?No conversational dyspnea ?Age appropriate judgment and insight ?Nml affect and mood ? ?Wheezy bronchitis - Plan: benzonatate (TESSALON) 200 MG capsule, azithromycin (ZITHROMAX) 250 MG tablet, dexamethasone (DECADRON) 6 MG tablet ? ?Decadron 12 mg/d for 2 days, Zpak in 2 d if no better. Tessalon Perles prn.  ?Continue to push fluids, practice good hand hygiene, cover mouth when coughing. ?F/u prn. If starting to experience fevers, shaking, or shortness of breath, seek immediate care. ?Pt voiced understanding and agreement to the plan. ? ?Shelda Pal, DO ?08/09/21 ?2:21 PM ? ?

## 2021-08-10 DIAGNOSIS — M722 Plantar fascial fibromatosis: Secondary | ICD-10-CM | POA: Diagnosis not present

## 2021-08-10 DIAGNOSIS — M79671 Pain in right foot: Secondary | ICD-10-CM | POA: Diagnosis not present

## 2021-08-10 DIAGNOSIS — R2689 Other abnormalities of gait and mobility: Secondary | ICD-10-CM | POA: Diagnosis not present

## 2021-08-13 ENCOUNTER — Encounter: Payer: Self-pay | Admitting: Family Medicine

## 2021-08-13 ENCOUNTER — Ambulatory Visit (INDEPENDENT_AMBULATORY_CARE_PROVIDER_SITE_OTHER): Payer: BC Managed Care – PPO

## 2021-08-13 DIAGNOSIS — J40 Bronchitis, not specified as acute or chronic: Secondary | ICD-10-CM | POA: Diagnosis not present

## 2021-08-13 MED ORDER — METHYLPREDNISOLONE ACETATE 80 MG/ML IJ SUSP
80.0000 mg | Freq: Once | INTRAMUSCULAR | Status: AC
  Administered 2021-08-13: 80 mg via INTRAMUSCULAR

## 2021-08-13 NOTE — Progress Notes (Signed)
Luke Vasquez is a 56 y.o. male presents to the office today for Prednisolone  injections, per physician's orders. ?Original order: Dr. Nani Ravens  ?Prednisolone (med), 80 mg(dose),  left deltoid (route) was administered  (location) today. Patient tolerated injection. ? ? ? ?Aletta Edmunds M Gabriella Guile  ?

## 2021-08-15 ENCOUNTER — Encounter: Payer: Self-pay | Admitting: Family Medicine

## 2021-08-16 ENCOUNTER — Other Ambulatory Visit: Payer: Self-pay | Admitting: Family Medicine

## 2021-08-16 DIAGNOSIS — Z6835 Body mass index (BMI) 35.0-35.9, adult: Secondary | ICD-10-CM | POA: Diagnosis not present

## 2021-08-16 DIAGNOSIS — R911 Solitary pulmonary nodule: Secondary | ICD-10-CM | POA: Diagnosis not present

## 2021-08-16 DIAGNOSIS — R0603 Acute respiratory distress: Secondary | ICD-10-CM | POA: Diagnosis not present

## 2021-08-16 DIAGNOSIS — J9811 Atelectasis: Secondary | ICD-10-CM | POA: Diagnosis not present

## 2021-08-16 DIAGNOSIS — J209 Acute bronchitis, unspecified: Secondary | ICD-10-CM | POA: Diagnosis not present

## 2021-08-16 DIAGNOSIS — E785 Hyperlipidemia, unspecified: Secondary | ICD-10-CM | POA: Diagnosis not present

## 2021-08-16 DIAGNOSIS — I2510 Atherosclerotic heart disease of native coronary artery without angina pectoris: Secondary | ICD-10-CM | POA: Diagnosis not present

## 2021-08-16 DIAGNOSIS — I10 Essential (primary) hypertension: Secondary | ICD-10-CM | POA: Diagnosis not present

## 2021-08-16 DIAGNOSIS — R109 Unspecified abdominal pain: Secondary | ICD-10-CM | POA: Diagnosis not present

## 2021-08-16 DIAGNOSIS — R0902 Hypoxemia: Secondary | ICD-10-CM | POA: Diagnosis not present

## 2021-08-16 DIAGNOSIS — R531 Weakness: Secondary | ICD-10-CM | POA: Diagnosis not present

## 2021-08-16 DIAGNOSIS — Z743 Need for continuous supervision: Secondary | ICD-10-CM | POA: Diagnosis not present

## 2021-08-16 DIAGNOSIS — R7989 Other specified abnormal findings of blood chemistry: Secondary | ICD-10-CM | POA: Diagnosis not present

## 2021-08-16 DIAGNOSIS — J302 Other seasonal allergic rhinitis: Secondary | ICD-10-CM | POA: Diagnosis not present

## 2021-08-16 DIAGNOSIS — E669 Obesity, unspecified: Secondary | ICD-10-CM | POA: Diagnosis not present

## 2021-08-16 DIAGNOSIS — R0689 Other abnormalities of breathing: Secondary | ICD-10-CM | POA: Diagnosis not present

## 2021-08-16 DIAGNOSIS — Z20822 Contact with and (suspected) exposure to covid-19: Secondary | ICD-10-CM | POA: Diagnosis not present

## 2021-08-16 DIAGNOSIS — R079 Chest pain, unspecified: Secondary | ICD-10-CM | POA: Diagnosis not present

## 2021-08-16 DIAGNOSIS — K219 Gastro-esophageal reflux disease without esophagitis: Secondary | ICD-10-CM | POA: Diagnosis not present

## 2021-08-16 DIAGNOSIS — R0602 Shortness of breath: Secondary | ICD-10-CM | POA: Diagnosis not present

## 2021-08-16 DIAGNOSIS — J45901 Unspecified asthma with (acute) exacerbation: Secondary | ICD-10-CM | POA: Diagnosis not present

## 2021-08-16 MED ORDER — DOXYCYCLINE HYCLATE 100 MG PO TABS
100.0000 mg | ORAL_TABLET | Freq: Two times a day (BID) | ORAL | 0 refills | Status: DC
Start: 2021-08-16 — End: 2021-08-20

## 2021-08-19 ENCOUNTER — Encounter: Payer: Self-pay | Admitting: Family Medicine

## 2021-08-20 ENCOUNTER — Ambulatory Visit (INDEPENDENT_AMBULATORY_CARE_PROVIDER_SITE_OTHER): Payer: BC Managed Care – PPO | Admitting: Family Medicine

## 2021-08-20 ENCOUNTER — Other Ambulatory Visit (HOSPITAL_BASED_OUTPATIENT_CLINIC_OR_DEPARTMENT_OTHER): Payer: Self-pay

## 2021-08-20 ENCOUNTER — Encounter: Payer: Self-pay | Admitting: Family Medicine

## 2021-08-20 VITALS — BP 118/78 | HR 73 | Temp 98.0°F | Ht 70.0 in | Wt 240.1 lb

## 2021-08-20 DIAGNOSIS — R0600 Dyspnea, unspecified: Secondary | ICD-10-CM

## 2021-08-20 DIAGNOSIS — Z09 Encounter for follow-up examination after completed treatment for conditions other than malignant neoplasm: Secondary | ICD-10-CM

## 2021-08-20 DIAGNOSIS — E1165 Type 2 diabetes mellitus with hyperglycemia: Secondary | ICD-10-CM | POA: Diagnosis not present

## 2021-08-20 LAB — MICROALBUMIN / CREATININE URINE RATIO
Creatinine,U: 122.9 mg/dL
Microalb Creat Ratio: 0.7 mg/g (ref 0.0–30.0)
Microalb, Ur: 0.9 mg/dL (ref 0.0–1.9)

## 2021-08-20 LAB — BASIC METABOLIC PANEL
BUN: 21 mg/dL (ref 6–23)
CO2: 27 mEq/L (ref 19–32)
Calcium: 9.3 mg/dL (ref 8.4–10.5)
Chloride: 102 mEq/L (ref 96–112)
Creatinine, Ser: 1.25 mg/dL (ref 0.40–1.50)
GFR: 65.02 mL/min (ref >60.00)
Glucose, Bld: 91 mg/dL (ref 70–99)
Potassium: 4.4 mEq/L (ref 3.5–5.1)
Sodium: 138 mEq/L (ref 135–145)

## 2021-08-20 LAB — CBC
HCT: 51.8 % (ref 39.0–52.0)
Hemoglobin: 17.2 g/dL — ABNORMAL HIGH (ref 13.0–17.0)
MCHC: 33.2 g/dL (ref 30.0–36.0)
MCV: 95.5 fl (ref 78.0–100.0)
Platelets: 192 10*3/uL (ref 150.0–400.0)
RBC: 5.42 Mil/uL (ref 4.22–5.81)
RDW: 13.5 % (ref 11.5–15.5)
WBC: 13.5 10*3/uL — ABNORMAL HIGH (ref 4.0–10.5)

## 2021-08-20 MED ORDER — IPRATROPIUM-ALBUTEROL 0.5-2.5 (3) MG/3ML IN SOLN
3.0000 mL | RESPIRATORY_TRACT | 1 refills | Status: AC | PRN
  Filled 2021-08-20: qty 360, 30d supply, fill #0

## 2021-08-20 NOTE — Patient Instructions (Signed)
Give [place] 2-3 business days to get the results of your labs back.  ? ?Keep the diet clean and stay active. ? ?Make sure your eye doctor knows about your diagnosis.  ? ?Don't worry about checking your sugars at this time. ? ?Let us know if you need anything. ?

## 2021-08-20 NOTE — Progress Notes (Signed)
Chief Complaint  ?Patient presents with  ? Hospitalization Follow-up  ? ? ?Subjective: ?Patient is a 56 y.o. male here for hosp f/u. ? ?Patient was admitted on 5/1 and discharged on 5/3 from [facility].  He was having weakness and shortness of breath prompting admission.  I had seen him 1 week ago virtually and started him on prednisone and doxycycline.  CTA chest and CT abdomen/pelvis were unremarkable.  He had a leukocytosis and lactic acidosis.  No distinct etiology was found for his dyspnea.  The hospital team speculates that he had a bacterial bronchitis or some sort of lung infection that was not fully treated long enough prior to admission.  He is finally starting to breathe better and his cough is improving.  It is no longer productive.  He is not currently on any antibiotics or steroids.  His A1c was checked in the hospital and unfortunately found to be 6.9.  He is working on improving his diet and physical activity.  He is not on any glucose lowering medications.  He is on lisinopril and rosuvastatin.  He does follow with an eye doctor routinely.  He has never had a pneumonia vaccine. ? ?Past Medical History:  ?Diagnosis Date  ? Depression   ? GERD (gastroesophageal reflux disease)   ? History of heart attack 2013  ? Hypertension   ? Seasonal allergies   ? ? ?Objective: ?BP 118/78 (BP Location: Left Arm, Cuff Size: Normal)   Pulse 73   Temp 98 ?F (36.7 ?C) (Oral)   Ht '5\' 10"'$  (1.778 m)   Wt 240 lb 2 oz (108.9 kg)   SpO2 98%   BMI 34.45 kg/m?  ?General: Awake, appears stated age ?Heart: RRR, no LE edema ?Lungs: CTAB, no rales, wheezes or rhonchi. No accessory muscle use ?Skin: No external lesions noted on the lower extremities with exception of a medial left midfoot flesh-colored raised lesion measuring 2 x 0.9 cm in diameter that is freely movable without TTP, erythema, excessive warmth, or drainage. ?Neuro: Sensation intact to pinprick bilaterally over the feet ?Psych: Age appropriate judgment  and insight, normal affect and mood ? ?Assessment and Plan: ?Type 2 diabetes mellitus with hyperglycemia, without long-term current use of insulin ([place]) - Plan: Microalbumin / creatinine urine ratio ? ?Hospital discharge follow-up ? ?Dyspnea, unspecified type - Plan: CBC, Basic metabolic panel, ipratropium-albuterol (DUONEB) 0.5-2.5 (3) MG/3ML SOLN ? ?New chronic issue.  Foot exam today.  Microalbumin creatinine ratio as above.  He does follow with an eye doctor routinely, he will update their team so they can monitor for retinopathy.  Counseled on diet and exercise.  I do not think he needs to start checking his sugars or start a hypoglycemic.  He is on a statin.  Because he was recently on steroids, I do not think he should get the PCV 20 today though I will see him in 3 months where we will get it.  I explained this to him. ?Appears to be doing better.  I did review his hospitalization/discharge summary notes from the outside hospital. ?Refilled DuoNeb solution for as needed use.  He is starting to turn the corner so we will not reinstitute any steroid or antibiotic therapy.  Check labs. ?The patient voiced understanding and agreement to the plan. ? ?I spent 40 minutes with the patient discussing the above plan in addition to reviewing his chart on the same day of the visit. ? ?Shelda Pal, DO ?08/20/21  ?11:45 AM ? ? ? ? ?

## 2021-08-24 ENCOUNTER — Telehealth: Payer: Self-pay | Admitting: Family Medicine

## 2021-08-24 DIAGNOSIS — M79671 Pain in right foot: Secondary | ICD-10-CM | POA: Diagnosis not present

## 2021-08-24 DIAGNOSIS — R2689 Other abnormalities of gait and mobility: Secondary | ICD-10-CM | POA: Diagnosis not present

## 2021-08-24 DIAGNOSIS — M722 Plantar fascial fibromatosis: Secondary | ICD-10-CM | POA: Diagnosis not present

## 2021-08-24 NOTE — Telephone Encounter (Signed)
Called left detailed message per PCP ok to return to PT ?

## 2021-08-24 NOTE — Telephone Encounter (Signed)
Luke Vasquez ([place]) called to ask to clearance for the pt to return to PT after being hosipitalized recently. Please Advise.  ?

## 2021-08-26 DIAGNOSIS — M722 Plantar fascial fibromatosis: Secondary | ICD-10-CM | POA: Diagnosis not present

## 2021-08-26 DIAGNOSIS — M7661 Achilles tendinitis, right leg: Secondary | ICD-10-CM | POA: Diagnosis not present

## 2021-08-31 DIAGNOSIS — M79671 Pain in right foot: Secondary | ICD-10-CM | POA: Diagnosis not present

## 2021-08-31 DIAGNOSIS — M722 Plantar fascial fibromatosis: Secondary | ICD-10-CM | POA: Diagnosis not present

## 2021-08-31 DIAGNOSIS — R2689 Other abnormalities of gait and mobility: Secondary | ICD-10-CM | POA: Diagnosis not present

## 2021-09-02 ENCOUNTER — Encounter: Payer: Self-pay | Admitting: Family Medicine

## 2021-09-02 DIAGNOSIS — I2510 Atherosclerotic heart disease of native coronary artery without angina pectoris: Secondary | ICD-10-CM

## 2021-09-02 MED ORDER — ROSUVASTATIN CALCIUM 20 MG PO TABS: 20.0000 mg | ORAL_TABLET | Freq: Every day | ORAL | 1 refills | Status: DC

## 2021-09-15 ENCOUNTER — Encounter: Payer: Self-pay | Admitting: Family Medicine

## 2021-09-16 MED ORDER — LISINOPRIL 10 MG PO TABS: 10.0000 mg | ORAL_TABLET | Freq: Every day | ORAL | 3 refills | Status: DC

## 2021-09-28 DIAGNOSIS — M79671 Pain in right foot: Secondary | ICD-10-CM | POA: Diagnosis not present

## 2021-10-04 ENCOUNTER — Encounter: Payer: Self-pay | Admitting: Family Medicine

## 2021-10-04 MED ORDER — RANOLAZINE ER 500 MG PO TB12: 500.0000 mg | ORAL_TABLET | Freq: Two times a day (BID) | ORAL | 0 refills | Status: AC

## 2021-10-14 DIAGNOSIS — M722 Plantar fascial fibromatosis: Secondary | ICD-10-CM | POA: Diagnosis not present

## 2021-10-28 ENCOUNTER — Encounter: Payer: Self-pay | Admitting: Family Medicine

## 2021-10-28 ENCOUNTER — Other Ambulatory Visit: Payer: Self-pay | Admitting: Family Medicine

## 2021-10-28 NOTE — Telephone Encounter (Signed)
Pt has a year supply of Montelukast at the pharmacy but I don't see the Brilinta on the pt's list. Please advise

## 2021-10-29 ENCOUNTER — Encounter: Payer: Self-pay | Admitting: Family Medicine

## 2021-10-29 ENCOUNTER — Other Ambulatory Visit: Payer: Self-pay | Admitting: Family Medicine

## 2021-10-29 MED ORDER — TICAGRELOR 60 MG PO TABS: 60.0000 mg | ORAL_TABLET | Freq: Two times a day (BID) | ORAL | 2 refills | Status: DC

## 2021-10-29 MED ORDER — TICAGRELOR 60 MG PO TABS: 60.0000 mg | ORAL_TABLET | Freq: Two times a day (BID) | ORAL | 2 refills | Status: AC

## 2021-11-01 ENCOUNTER — Encounter: Payer: Self-pay | Admitting: Family Medicine

## 2021-11-11 ENCOUNTER — Encounter: Payer: Self-pay | Admitting: Family Medicine

## 2021-11-11 DIAGNOSIS — R1013 Epigastric pain: Secondary | ICD-10-CM

## 2021-11-11 MED ORDER — OMEPRAZOLE 20 MG PO CPDR: DELAYED_RELEASE_CAPSULE | ORAL | 3 refills | Status: DC

## 2021-12-07 ENCOUNTER — Encounter: Payer: Self-pay | Admitting: Family Medicine

## 2021-12-07 ENCOUNTER — Telehealth (INDEPENDENT_AMBULATORY_CARE_PROVIDER_SITE_OTHER): Payer: BC Managed Care – PPO | Admitting: Family Medicine

## 2021-12-07 ENCOUNTER — Other Ambulatory Visit: Payer: Self-pay | Admitting: Family Medicine

## 2021-12-07 DIAGNOSIS — R110 Nausea: Secondary | ICD-10-CM

## 2021-12-07 DIAGNOSIS — M549 Dorsalgia, unspecified: Secondary | ICD-10-CM | POA: Diagnosis not present

## 2021-12-07 MED ORDER — TIZANIDINE HCL 4 MG PO TABS: 4.0000 mg | ORAL_TABLET | Freq: Four times a day (QID) | ORAL | 0 refills | Status: AC | PRN

## 2021-12-07 NOTE — Progress Notes (Signed)
Chief Complaint  Patient presents with   Nausea    And dry heaving -- onset: 1 day      Subjective Luke Vasquez is a 56 y.o. male who presents with nausea and flank pain. Due to COVID-19 pandemic, we are interacting via web portal for an electronic face-to-face visit. I verified patient's ID using 2 identifiers. Patient agreed to proceed with visit via this method. Patient is at home, I am at office. Patient and I are present for visit.  Symptoms began 1 d ago.  Patient has chills and nausea, flank pain Patient denies abdominal pain, cramping, vomiting, diarrhea, fever, and urinary complaints Treatment to date: phenergan Sick contacts: none known  Past Medical History:  Diagnosis Date   Depression    GERD (gastroesophageal reflux disease)    History of heart attack 2013   Hypertension    Seasonal allergies     Exam No conversational dyspnea Age appropriate judgment and insight Nml affect and mood  Assessment and Plan  Mid-back pain, acute - Plan: tiZANidine (ZANAFLEX) 4 MG tablet  Nausea  Continue Phenergan for the nausea, add Zanaflex for mid back pain. Heat, ice, tylenol. Send message if no better.  The patient voiced understanding and agreement to the plan.  [place], DO 12/07/21  12:28 PM

## 2021-12-13 ENCOUNTER — Encounter: Payer: Self-pay | Admitting: Family Medicine

## 2021-12-13 DIAGNOSIS — I2510 Atherosclerotic heart disease of native coronary artery without angina pectoris: Secondary | ICD-10-CM

## 2021-12-13 MED ORDER — LISINOPRIL 10 MG PO TABS: 10.0000 mg | ORAL_TABLET | Freq: Every day | ORAL | 3 refills | Status: DC

## 2021-12-13 MED ORDER — ROSUVASTATIN CALCIUM 20 MG PO TABS: 20.0000 mg | ORAL_TABLET | Freq: Every day | ORAL | 1 refills | Status: DC

## 2021-12-29 ENCOUNTER — Encounter: Payer: Self-pay | Admitting: Family Medicine

## 2021-12-29 ENCOUNTER — Ambulatory Visit (INDEPENDENT_AMBULATORY_CARE_PROVIDER_SITE_OTHER): Payer: BC Managed Care – PPO | Admitting: Family Medicine

## 2021-12-29 VITALS — BP 121/85 | HR 64 | Temp 98.4°F | Ht 70.0 in | Wt 230.0 lb

## 2021-12-29 DIAGNOSIS — K219 Gastro-esophageal reflux disease without esophagitis: Secondary | ICD-10-CM | POA: Diagnosis not present

## 2021-12-29 DIAGNOSIS — R112 Nausea with vomiting, unspecified: Secondary | ICD-10-CM | POA: Diagnosis not present

## 2021-12-29 DIAGNOSIS — R1032 Left lower quadrant pain: Secondary | ICD-10-CM

## 2021-12-29 MED ORDER — PANTOPRAZOLE SODIUM 40 MG PO TBEC: 40.0000 mg | DELAYED_RELEASE_TABLET | Freq: Every day | ORAL | 3 refills | Status: DC

## 2021-12-29 MED ORDER — PROMETHAZINE HCL 25 MG PO TABS: 25.0000 mg | ORAL_TABLET | Freq: Four times a day (QID) | ORAL | 0 refills | Status: AC | PRN

## 2021-12-29 NOTE — Progress Notes (Signed)
Chief Complaint  Patient presents with   Nausea    Vomiting      Subjective Luke Vasquez is a 56 y.o. male who presents with vomiting and diffuse abd discomfort, mainly in the LLQ. LLQ pain has been there for 1 mo.  Symptoms began early this AM.  Patient has abdominal pain and vomiting Patient denies diarrhea, fever, URI symptoms, cough, and recent travel. Treatment to date: no Sick contacts: none known  Past Medical History:  Diagnosis Date   Depression    GERD (gastroesophageal reflux disease)    History of heart attack 2013   Hypertension    Seasonal allergies     Exam BP 121/85   Pulse 64   Temp 98.4 F (36.9 C) (Oral)   Ht '5\' 10"'$  (1.778 m)   Wt 230 lb (104.3 kg)   SpO2 98%   BMI 33.00 kg/m  General:  well developed, well hydrated, in no apparent distress Skin:  warm, no pallor or diaphoresis, no rashes Throat/Pharynx:  lips and gingiva without lesion; tongue and uvula midline; non-inflamed pharynx; no exudates or postnasal drainage Lungs:  clear to auscultation, breath sounds equal bilaterally, no respiratory distress, no wheezes Cardio:  RRR Abdomen:  abdomen soft, diffusely ttp, worse in LLQ; bowel sounds normal; no masses or organomegaly Psych: Appropriate judgement/insight  Assessment and Plan  LLQ abdominal pain - Plan: Ambulatory referral to Gastroenterology  Gastroesophageal reflux disease, unspecified whether esophagitis present - Plan: pantoprazole (PROTONIX) 40 MG tablet  Nausea and vomiting, unspecified vomiting type - Plan: promethazine (PHENERGAN) 25 MG tablet  Refer to GI for LLQ pain. Chronic, ncontrolled. This has been recurrent so will change omeprazole to pantoprazole 40 mg/d.  Phenergan prn for nausea. Avoid aggravating foods, discussed advancing diet. Letter for work given.  The patient voiced understanding and agreement to the plan.  [place], DO 12/29/21  8:54 AM

## 2021-12-29 NOTE — Patient Instructions (Signed)
If you do not hear anything about your referral in the next 1-2 weeks, call our office and ask for an update.  The only lifestyle changes that have data behind them are weight loss for the overweight/obese and elevating the head of the bed. Finding out which foods/positions are triggers is important.  Let us know if you need anything.

## 2022-01-06 ENCOUNTER — Encounter: Payer: Self-pay | Admitting: Family Medicine

## 2022-01-06 DIAGNOSIS — R1084 Generalized abdominal pain: Secondary | ICD-10-CM | POA: Diagnosis not present

## 2022-01-06 DIAGNOSIS — R112 Nausea with vomiting, unspecified: Secondary | ICD-10-CM | POA: Diagnosis not present

## 2022-01-07 DIAGNOSIS — R109 Unspecified abdominal pain: Secondary | ICD-10-CM | POA: Diagnosis not present

## 2022-01-07 DIAGNOSIS — Z8719 Personal history of other diseases of the digestive system: Secondary | ICD-10-CM | POA: Diagnosis not present

## 2022-01-07 DIAGNOSIS — R1084 Generalized abdominal pain: Secondary | ICD-10-CM | POA: Diagnosis not present

## 2022-01-10 ENCOUNTER — Encounter: Payer: Self-pay | Admitting: Family Medicine

## 2022-01-11 DIAGNOSIS — R1012 Left upper quadrant pain: Secondary | ICD-10-CM | POA: Diagnosis not present

## 2022-01-11 DIAGNOSIS — K219 Gastro-esophageal reflux disease without esophagitis: Secondary | ICD-10-CM | POA: Diagnosis not present

## 2022-01-11 DIAGNOSIS — R110 Nausea: Secondary | ICD-10-CM | POA: Diagnosis not present

## 2022-01-11 DIAGNOSIS — R1032 Left lower quadrant pain: Secondary | ICD-10-CM | POA: Diagnosis not present

## 2022-01-12 DIAGNOSIS — K449 Diaphragmatic hernia without obstruction or gangrene: Secondary | ICD-10-CM | POA: Diagnosis not present

## 2022-01-12 DIAGNOSIS — K3189 Other diseases of stomach and duodenum: Secondary | ICD-10-CM | POA: Diagnosis not present

## 2022-01-12 DIAGNOSIS — K2970 Gastritis, unspecified, without bleeding: Secondary | ICD-10-CM | POA: Diagnosis not present

## 2022-01-12 DIAGNOSIS — K219 Gastro-esophageal reflux disease without esophagitis: Secondary | ICD-10-CM | POA: Diagnosis not present

## 2022-02-08 ENCOUNTER — Encounter: Payer: Self-pay | Admitting: Family Medicine

## 2022-02-08 MED ORDER — MONTELUKAST SODIUM 10 MG PO TABS: 10.0000 mg | ORAL_TABLET | Freq: Every day | ORAL | 3 refills | Status: DC

## 2022-02-08 MED ORDER — ICOSAPENT ETHYL 1 G PO CAPS: 2.0000 g | ORAL_CAPSULE | Freq: Two times a day (BID) | ORAL | 3 refills | Status: AC

## 2022-02-10 ENCOUNTER — Telehealth: Payer: Self-pay | Admitting: *Deleted

## 2022-02-10 ENCOUNTER — Encounter: Payer: Self-pay | Admitting: *Deleted

## 2022-02-10 NOTE — Telephone Encounter (Signed)
Prior auth started via cover my meds.  Awaiting determination.  Key: [id number]

## 2022-02-10 NOTE — Telephone Encounter (Signed)
Approved  Message from plan: Effective from 02/10/2022 through 02/09/2023.

## 2022-02-11 DIAGNOSIS — K219 Gastro-esophageal reflux disease without esophagitis: Secondary | ICD-10-CM | POA: Diagnosis not present

## 2022-02-11 DIAGNOSIS — R1012 Left upper quadrant pain: Secondary | ICD-10-CM | POA: Diagnosis not present

## 2022-02-11 DIAGNOSIS — R1032 Left lower quadrant pain: Secondary | ICD-10-CM | POA: Diagnosis not present

## 2022-02-11 DIAGNOSIS — R110 Nausea: Secondary | ICD-10-CM | POA: Diagnosis not present

## 2022-02-28 DIAGNOSIS — R059 Cough, unspecified: Secondary | ICD-10-CM | POA: Diagnosis not present

## 2022-02-28 DIAGNOSIS — J0100 Acute maxillary sinusitis, unspecified: Secondary | ICD-10-CM | POA: Diagnosis not present

## 2022-03-03 DIAGNOSIS — I10 Essential (primary) hypertension: Secondary | ICD-10-CM | POA: Diagnosis not present

## 2022-03-03 DIAGNOSIS — I25118 Atherosclerotic heart disease of native coronary artery with other forms of angina pectoris: Secondary | ICD-10-CM | POA: Diagnosis not present

## 2022-03-07 ENCOUNTER — Encounter: Payer: Self-pay | Admitting: Family Medicine

## 2022-03-11 ENCOUNTER — Encounter: Payer: Self-pay | Admitting: Family Medicine

## 2022-03-11 DIAGNOSIS — I2510 Atherosclerotic heart disease of native coronary artery without angina pectoris: Secondary | ICD-10-CM

## 2022-03-14 ENCOUNTER — Encounter: Payer: Self-pay | Admitting: Family Medicine

## 2022-03-14 MED ORDER — ROSUVASTATIN CALCIUM 20 MG PO TABS: 20.0000 mg | ORAL_TABLET | Freq: Every day | ORAL | 1 refills | Status: DC

## 2022-03-14 MED ORDER — METOPROLOL TARTRATE 25 MG PO TABS: 12.5000 mg | ORAL_TABLET | Freq: Two times a day (BID) | ORAL | 1 refills | Status: AC

## 2022-03-21 DIAGNOSIS — J8489 Other specified interstitial pulmonary diseases: Secondary | ICD-10-CM | POA: Diagnosis not present

## 2022-03-21 DIAGNOSIS — B9689 Other specified bacterial agents as the cause of diseases classified elsewhere: Secondary | ICD-10-CM | POA: Diagnosis not present

## 2022-03-21 DIAGNOSIS — J019 Acute sinusitis, unspecified: Secondary | ICD-10-CM | POA: Diagnosis not present

## 2022-03-21 DIAGNOSIS — R051 Acute cough: Secondary | ICD-10-CM | POA: Diagnosis not present

## 2022-03-30 DIAGNOSIS — R053 Chronic cough: Secondary | ICD-10-CM | POA: Diagnosis not present

## 2022-03-30 DIAGNOSIS — G4733 Obstructive sleep apnea (adult) (pediatric): Secondary | ICD-10-CM | POA: Diagnosis not present

## 2022-03-30 DIAGNOSIS — J849 Interstitial pulmonary disease, unspecified: Secondary | ICD-10-CM | POA: Diagnosis not present

## 2022-03-30 DIAGNOSIS — J4550 Severe persistent asthma, uncomplicated: Secondary | ICD-10-CM | POA: Diagnosis not present

## 2022-04-04 ENCOUNTER — Encounter: Payer: Self-pay | Admitting: Family Medicine

## 2022-04-04 MED ORDER — LISINOPRIL 10 MG PO TABS: 10.0000 mg | ORAL_TABLET | Freq: Every day | ORAL | 3 refills | Status: DC

## 2022-04-06 DIAGNOSIS — J849 Interstitial pulmonary disease, unspecified: Secondary | ICD-10-CM | POA: Diagnosis not present

## 2022-04-06 DIAGNOSIS — R918 Other nonspecific abnormal finding of lung field: Secondary | ICD-10-CM | POA: Diagnosis not present

## 2022-04-20 DIAGNOSIS — G4733 Obstructive sleep apnea (adult) (pediatric): Secondary | ICD-10-CM | POA: Diagnosis not present

## 2022-05-06 DIAGNOSIS — I10 Essential (primary) hypertension: Secondary | ICD-10-CM | POA: Diagnosis not present

## 2022-05-06 DIAGNOSIS — Z87442 Personal history of urinary calculi: Secondary | ICD-10-CM | POA: Diagnosis not present

## 2022-05-06 DIAGNOSIS — R3129 Other microscopic hematuria: Secondary | ICD-10-CM | POA: Diagnosis not present

## 2022-05-06 DIAGNOSIS — R300 Dysuria: Secondary | ICD-10-CM | POA: Diagnosis not present

## 2022-05-06 DIAGNOSIS — N200 Calculus of kidney: Secondary | ICD-10-CM | POA: Diagnosis not present

## 2022-05-09 ENCOUNTER — Other Ambulatory Visit: Payer: Self-pay | Admitting: Family Medicine

## 2022-05-09 ENCOUNTER — Telehealth: Payer: Self-pay

## 2022-05-09 ENCOUNTER — Encounter: Payer: Self-pay | Admitting: Family Medicine

## 2022-05-09 DIAGNOSIS — K219 Gastro-esophageal reflux disease without esophagitis: Secondary | ICD-10-CM

## 2022-05-09 DIAGNOSIS — N281 Cyst of kidney, acquired: Secondary | ICD-10-CM | POA: Diagnosis not present

## 2022-05-09 DIAGNOSIS — R109 Unspecified abdominal pain: Secondary | ICD-10-CM | POA: Diagnosis not present

## 2022-05-09 DIAGNOSIS — Z87442 Personal history of urinary calculi: Secondary | ICD-10-CM | POA: Diagnosis not present

## 2022-05-09 DIAGNOSIS — I10 Essential (primary) hypertension: Secondary | ICD-10-CM | POA: Diagnosis not present

## 2022-05-09 MED ORDER — PANTOPRAZOLE SODIUM 40 MG PO TBEC: 40.0000 mg | DELAYED_RELEASE_TABLET | Freq: Every day | ORAL | 3 refills | Status: DC

## 2022-05-09 MED ORDER — MONTELUKAST SODIUM 10 MG PO TABS: 10.0000 mg | ORAL_TABLET | Freq: Every day | ORAL | 3 refills | Status: AC

## 2022-05-09 NOTE — Addendum Note (Signed)
Addended by: Sharon Seller B on: 05/09/2022 09:27 AM   Modules accepted: Orders

## 2022-05-09 NOTE — Telephone Encounter (Signed)
PA initiated via Covermymeds; KEY: [id number]. Awaiting determination.

## 2022-05-10 NOTE — Telephone Encounter (Signed)
PA approved.   Effective from 05/09/2022 through 05/08/2023.

## 2022-05-10 NOTE — Telephone Encounter (Signed)
Patient informed of approval by my chart.

## 2022-05-13 DIAGNOSIS — G4733 Obstructive sleep apnea (adult) (pediatric): Secondary | ICD-10-CM | POA: Diagnosis not present

## 2022-06-01 DIAGNOSIS — G4733 Obstructive sleep apnea (adult) (pediatric): Secondary | ICD-10-CM | POA: Diagnosis not present

## 2022-06-07 ENCOUNTER — Encounter: Payer: Self-pay | Admitting: Family Medicine

## 2022-06-07 DIAGNOSIS — K219 Gastro-esophageal reflux disease without esophagitis: Secondary | ICD-10-CM

## 2022-06-07 MED ORDER — PANTOPRAZOLE SODIUM 40 MG PO TBEC: 40.0000 mg | DELAYED_RELEASE_TABLET | Freq: Every day | ORAL | 3 refills | Status: DC

## 2022-06-21 DIAGNOSIS — I10 Essential (primary) hypertension: Secondary | ICD-10-CM | POA: Diagnosis not present

## 2022-06-21 DIAGNOSIS — K529 Noninfective gastroenteritis and colitis, unspecified: Secondary | ICD-10-CM | POA: Diagnosis not present

## 2022-06-23 ENCOUNTER — Encounter: Payer: Self-pay | Admitting: Family Medicine

## 2022-06-23 DIAGNOSIS — I2510 Atherosclerotic heart disease of native coronary artery without angina pectoris: Secondary | ICD-10-CM

## 2022-06-23 MED ORDER — ROSUVASTATIN CALCIUM 20 MG PO TABS: 20.0000 mg | ORAL_TABLET | Freq: Every day | ORAL | 1 refills | Status: AC

## 2022-06-30 DIAGNOSIS — G4733 Obstructive sleep apnea (adult) (pediatric): Secondary | ICD-10-CM | POA: Diagnosis not present

## 2022-07-06 DIAGNOSIS — J849 Interstitial pulmonary disease, unspecified: Secondary | ICD-10-CM | POA: Diagnosis not present

## 2022-07-06 DIAGNOSIS — R053 Chronic cough: Secondary | ICD-10-CM | POA: Diagnosis not present

## 2022-07-06 DIAGNOSIS — J4550 Severe persistent asthma, uncomplicated: Secondary | ICD-10-CM | POA: Diagnosis not present

## 2022-07-06 DIAGNOSIS — G4733 Obstructive sleep apnea (adult) (pediatric): Secondary | ICD-10-CM | POA: Diagnosis not present

## 2022-07-08 ENCOUNTER — Encounter: Payer: Self-pay | Admitting: Family Medicine

## 2022-07-08 MED ORDER — LISINOPRIL 10 MG PO TABS: 10.0000 mg | ORAL_TABLET | Freq: Every day | ORAL | 3 refills | Status: AC

## 2022-07-20 DIAGNOSIS — I10 Essential (primary) hypertension: Secondary | ICD-10-CM | POA: Diagnosis not present

## 2022-07-20 DIAGNOSIS — M545 Low back pain, unspecified: Secondary | ICD-10-CM | POA: Diagnosis not present

## 2022-07-31 DIAGNOSIS — G4733 Obstructive sleep apnea (adult) (pediatric): Secondary | ICD-10-CM | POA: Diagnosis not present

## 2022-08-10 ENCOUNTER — Encounter: Payer: Self-pay | Admitting: Family Medicine

## 2022-08-11 ENCOUNTER — Encounter (INDEPENDENT_AMBULATORY_CARE_PROVIDER_SITE_OTHER): Payer: BC Managed Care – PPO | Admitting: Family Medicine

## 2022-08-11 ENCOUNTER — Encounter: Payer: Self-pay | Admitting: Family Medicine

## 2022-08-11 DIAGNOSIS — K219 Gastro-esophageal reflux disease without esophagitis: Secondary | ICD-10-CM | POA: Diagnosis not present

## 2022-08-11 MED ORDER — PANTOPRAZOLE SODIUM 40 MG PO TBEC
40.0000 mg | DELAYED_RELEASE_TABLET | Freq: Two times a day (BID) | ORAL | 0 refills | Status: AC
Start: 2022-08-11 — End: ?

## 2022-08-11 NOTE — Telephone Encounter (Signed)
Please see the MyChart message reply(ies) for my assessment and plan.  The patient gave consent for this Medical Advice Message and is aware that it may result in a bill to their insurance company as well as the possibility that this may result in a co-payment or deductible. They are an established patient, but are not seeking medical advice exclusively about a problem treated during an in person or video visit in the last 7 days. I did not recommend an in person or video visit within 7 days of my reply.  I spent a total of 8 minutes cumulative time within 7 days through MyChart messaging Antolin Belsito Paul Jeannelle Wiens, DO  

## 2022-08-18 DIAGNOSIS — M5440 Lumbago with sciatica, unspecified side: Secondary | ICD-10-CM | POA: Diagnosis not present

## 2022-08-18 DIAGNOSIS — I10 Essential (primary) hypertension: Secondary | ICD-10-CM | POA: Diagnosis not present

## 2022-08-18 DIAGNOSIS — M722 Plantar fascial fibromatosis: Secondary | ICD-10-CM | POA: Diagnosis not present

## 2022-08-30 DIAGNOSIS — G4733 Obstructive sleep apnea (adult) (pediatric): Secondary | ICD-10-CM | POA: Diagnosis not present

## 2022-09-18 DIAGNOSIS — Z23 Encounter for immunization: Secondary | ICD-10-CM | POA: Diagnosis not present

## 2022-09-18 DIAGNOSIS — S61012A Laceration without foreign body of left thumb without damage to nail, initial encounter: Secondary | ICD-10-CM | POA: Diagnosis not present

## 2022-09-18 DIAGNOSIS — W458XXA Other foreign body or object entering through skin, initial encounter: Secondary | ICD-10-CM | POA: Diagnosis not present

## 2022-09-18 DIAGNOSIS — S61412A Laceration without foreign body of left hand, initial encounter: Secondary | ICD-10-CM | POA: Diagnosis not present

## 2022-09-18 DIAGNOSIS — I10 Essential (primary) hypertension: Secondary | ICD-10-CM | POA: Diagnosis not present

## 2022-09-30 DIAGNOSIS — G4733 Obstructive sleep apnea (adult) (pediatric): Secondary | ICD-10-CM | POA: Diagnosis not present

## 2022-10-10 DIAGNOSIS — J069 Acute upper respiratory infection, unspecified: Secondary | ICD-10-CM | POA: Diagnosis not present

## 2022-10-10 DIAGNOSIS — I10 Essential (primary) hypertension: Secondary | ICD-10-CM | POA: Diagnosis not present

## 2022-10-10 DIAGNOSIS — R112 Nausea with vomiting, unspecified: Secondary | ICD-10-CM | POA: Diagnosis not present

## 2022-10-26 DIAGNOSIS — Z713 Dietary counseling and surveillance: Secondary | ICD-10-CM | POA: Diagnosis not present

## 2022-10-26 DIAGNOSIS — E6609 Other obesity due to excess calories: Secondary | ICD-10-CM | POA: Diagnosis not present

## 2022-10-26 DIAGNOSIS — Z7189 Other specified counseling: Secondary | ICD-10-CM | POA: Diagnosis not present

## 2022-10-26 DIAGNOSIS — Z1331 Encounter for screening for depression: Secondary | ICD-10-CM | POA: Diagnosis not present

## 2022-10-26 DIAGNOSIS — I10 Essential (primary) hypertension: Secondary | ICD-10-CM | POA: Diagnosis not present

## 2022-10-26 DIAGNOSIS — Z7182 Exercise counseling: Secondary | ICD-10-CM | POA: Diagnosis not present

## 2022-10-26 DIAGNOSIS — Z6832 Body mass index (BMI) 32.0-32.9, adult: Secondary | ICD-10-CM | POA: Diagnosis not present

## 2022-10-30 DIAGNOSIS — G4733 Obstructive sleep apnea (adult) (pediatric): Secondary | ICD-10-CM | POA: Diagnosis not present

## 2022-11-29 DIAGNOSIS — E6609 Other obesity due to excess calories: Secondary | ICD-10-CM | POA: Diagnosis not present

## 2022-11-29 DIAGNOSIS — E119 Type 2 diabetes mellitus without complications: Secondary | ICD-10-CM | POA: Diagnosis not present

## 2022-11-29 DIAGNOSIS — I10 Essential (primary) hypertension: Secondary | ICD-10-CM | POA: Diagnosis not present

## 2022-11-29 DIAGNOSIS — Z6831 Body mass index (BMI) 31.0-31.9, adult: Secondary | ICD-10-CM | POA: Diagnosis not present

## 2022-11-30 DIAGNOSIS — G4733 Obstructive sleep apnea (adult) (pediatric): Secondary | ICD-10-CM | POA: Diagnosis not present

## 2022-12-31 DIAGNOSIS — G4733 Obstructive sleep apnea (adult) (pediatric): Secondary | ICD-10-CM | POA: Diagnosis not present

## 2023-01-30 DIAGNOSIS — G4733 Obstructive sleep apnea (adult) (pediatric): Secondary | ICD-10-CM | POA: Diagnosis not present

## 2023-01-31 ENCOUNTER — Other Ambulatory Visit (HOSPITAL_COMMUNITY): Payer: Self-pay

## 2023-01-31 ENCOUNTER — Telehealth: Payer: Self-pay

## 2023-01-31 NOTE — Telephone Encounter (Signed)
Pharmacy Patient Advocate Encounter   Received notification from CoverMyMeds that prior authorization for Vascepa is required/requested.   Insurance verification completed.   The patient is insured through [facility] .   Per test claim: PA required; PA submitted to [facility] via CoverMyMeds Key/confirmation #/EOC Key: [id number]  Status is pending

## 2023-02-06 ENCOUNTER — Other Ambulatory Visit (HOSPITAL_COMMUNITY): Payer: Self-pay

## 2023-02-06 NOTE — Telephone Encounter (Signed)
Per test claim, prior authorization for Vascepa does not require a prior authorization.

## 2023-02-10 DIAGNOSIS — G4733 Obstructive sleep apnea (adult) (pediatric): Secondary | ICD-10-CM | POA: Diagnosis not present

## 2023-02-16 DIAGNOSIS — G4733 Obstructive sleep apnea (adult) (pediatric): Secondary | ICD-10-CM | POA: Diagnosis not present

## 2024-01-15 NOTE — Telephone Encounter (Signed)
 error

## 2024-01-15 NOTE — Telephone Encounter (Signed)
 Pt phoned in today wanting his CT scan P/A canceled due to he is going to another facilty and they will be doing a ct scan.I did get it canceled and notified pt per message on phone.
